# Patient Record
Sex: Male | Born: 1950 | Race: White | Hispanic: No | Marital: Married | Smoking: Never smoker
Health system: Southern US, Community
[De-identification: ages and names within clinical notes are randomized; demographics above are authoritative.]

## PROBLEM LIST (undated history)

## (undated) DIAGNOSIS — K219 Gastro-esophageal reflux disease without esophagitis: Secondary | ICD-10-CM

## (undated) DIAGNOSIS — Z8719 Personal history of other diseases of the digestive system: Secondary | ICD-10-CM

## (undated) DIAGNOSIS — I493 Ventricular premature depolarization: Secondary | ICD-10-CM

## (undated) DIAGNOSIS — B019 Varicella without complication: Secondary | ICD-10-CM

## (undated) DIAGNOSIS — I10 Essential (primary) hypertension: Secondary | ICD-10-CM

## (undated) DIAGNOSIS — E785 Hyperlipidemia, unspecified: Secondary | ICD-10-CM

## (undated) HISTORY — DX: Ventricular premature depolarization: I49.3

## (undated) HISTORY — DX: Essential (primary) hypertension: I10

## (undated) HISTORY — DX: Hyperlipidemia, unspecified: E78.5

## (undated) HISTORY — PX: COLONOSCOPY: SHX174

## (undated) HISTORY — DX: Gastro-esophageal reflux disease without esophagitis: K21.9

## (undated) HISTORY — PX: OTHER SURGICAL HISTORY: SHX169

## (undated) HISTORY — DX: Varicella without complication: B01.9

## (undated) HISTORY — DX: Personal history of other diseases of the digestive system: Z87.19

---

## 2012-01-17 DEATH — deceased

## 2015-04-28 DIAGNOSIS — L309 Dermatitis, unspecified: Secondary | ICD-10-CM | POA: Diagnosis not present

## 2015-04-28 DIAGNOSIS — L570 Actinic keratosis: Secondary | ICD-10-CM | POA: Diagnosis not present

## 2015-09-01 ENCOUNTER — Ambulatory Visit: Payer: Self-pay | Admitting: Family Medicine

## 2015-11-06 ENCOUNTER — Ambulatory Visit (INDEPENDENT_AMBULATORY_CARE_PROVIDER_SITE_OTHER): Payer: Medicare Other | Admitting: Family Medicine

## 2015-11-06 ENCOUNTER — Encounter: Payer: Self-pay | Admitting: Family Medicine

## 2015-11-06 VITALS — BP 117/67 | HR 79 | Temp 98.1°F | Ht 71.75 in | Wt 210.0 lb

## 2015-11-06 DIAGNOSIS — I10 Essential (primary) hypertension: Secondary | ICD-10-CM | POA: Insufficient documentation

## 2015-11-06 DIAGNOSIS — K219 Gastro-esophageal reflux disease without esophagitis: Secondary | ICD-10-CM | POA: Diagnosis not present

## 2015-11-06 DIAGNOSIS — Z1159 Encounter for screening for other viral diseases: Secondary | ICD-10-CM

## 2015-11-06 DIAGNOSIS — E785 Hyperlipidemia, unspecified: Secondary | ICD-10-CM | POA: Diagnosis not present

## 2015-11-06 DIAGNOSIS — G609 Hereditary and idiopathic neuropathy, unspecified: Secondary | ICD-10-CM

## 2015-11-06 MED ORDER — SIMVASTATIN 20 MG PO TABS: 20.0000 mg | ORAL_TABLET | Freq: Every day | ORAL | Status: DC

## 2015-11-06 MED ORDER — GABAPENTIN 300 MG PO CAPS: 300.0000 mg | ORAL_CAPSULE | Freq: Every day | ORAL | Status: DC

## 2015-11-06 MED ORDER — VALSARTAN-HYDROCHLOROTHIAZIDE 80-12.5 MG PO TABS: 1.0000 | ORAL_TABLET | Freq: Every day | ORAL | Status: DC

## 2015-11-06 NOTE — Progress Notes (Signed)
Pre visit review using our clinic review tool, if applicable. No additional management support is needed unless otherwise documented below in the visit note. 

## 2015-11-06 NOTE — Progress Notes (Signed)
Patient ID: David Nielsen, male    DOB: July 25, 1951  Age: 65 y.o. MRN: [id number]    Subjective:  Subjective HPI David Nielsen presents to establish and get refills --- hx htn, hyperlipidemia and peripheral neuropathy.    Review of Systems  Constitutional: Negative for diaphoresis, appetite change, fatigue and unexpected weight change.  Eyes: Negative for pain, redness and visual disturbance.  Respiratory: Negative for cough, chest tightness, shortness of breath and wheezing.   Cardiovascular: Negative for chest pain, palpitations and leg swelling.  Endocrine: Negative for cold intolerance, heat intolerance, polydipsia, polyphagia and polyuria.  Genitourinary: Negative for dysuria, frequency and difficulty urinating.  Neurological: Negative for dizziness, light-headedness, numbness and headaches.    History Past Medical History  Diagnosis Date  . Chicken pox   . H/O gastroesophageal reflux (GERD)   . PVC's (premature ventricular contractions)   . HTN (hypertension)   . Hyperlipidemia     He has no past surgical history on file.   His family history includes Alcohol abuse in his son; Alcohol abuse (age of onset: 16) in his father; Cancer in his sister; Colon cancer in his brother; Stroke in his mother.He reports that he has never smoked. He has never used smokeless tobacco. He reports that he drinks alcohol. He reports that he does not use illicit drugs.  No current outpatient prescriptions on file prior to visit.   No current facility-administered medications on file prior to visit.     Objective:  Objective Physical Exam  Constitutional: He is oriented to person, place, and time. Vital signs are normal. He appears well-developed and well-nourished. He is sleeping.  HENT:  Head: Normocephalic and atraumatic.  Mouth/Throat: Oropharynx is clear and moist.  Eyes: EOM are normal. Pupils are equal, round, and reactive to light.  Neck: Normal range of motion. Neck supple. No  thyromegaly present.  Cardiovascular: Normal rate and regular rhythm.   No murmur heard. Pulmonary/Chest: Effort normal and breath sounds normal. No respiratory distress. He has no wheezes. He has no rales. He exhibits no tenderness.  Musculoskeletal: He exhibits no edema or tenderness.  Neurological: He is alert and oriented to person, place, and time.  Skin: Skin is warm and dry.  Psychiatric: He has a normal mood and affect. His behavior is normal. Judgment and thought content normal.  Nursing note and vitals reviewed.  BP 117/67 mmHg  Pulse 79  Temp(Src) 98.1 F (36.7 C) (Oral)  Ht 5' 11.75" (1.822 m)  Wt 210 lb (95.255 kg)  BMI 28.69 kg/m2  SpO2 96% Wt Readings from Last 3 Encounters:  11/06/15 210 lb (95.255 kg)     No results found for: WBC, HGB, HCT, PLT, GLUCOSE, CHOL, TRIG, HDL, LDLDIRECT, LDLCALC, ALT, AST, NA, K, CL, CREATININE, BUN, CO2, TSH, PSA, INR, GLUF, HGBA1C, MICROALBUR  Patient was never admitted.   Assessment & Plan:  Plan I have changed Mr. Eschmann gabapentin and simvastatin. I am also having him maintain his valsartan-hydrochlorothiazide.  Meds ordered this encounter  Medications  . DISCONTD: gabapentin (NEURONTIN) 300 MG capsule    Sig: Take 300 mg by mouth at bedtime.  Marland Kitchen DISCONTD: simvastatin (ZOCOR) 20 MG tablet    Sig: Take 20 mg by mouth daily.  Marland Kitchen DISCONTD: valsartan-hydrochlorothiazide (DIOVAN-HCT) 80-12.5 MG tablet    Sig: Take 1 tablet by mouth daily.  Marland Kitchen gabapentin (NEURONTIN) 300 MG capsule    Sig: Take 1 capsule (300 mg total) by mouth at bedtime.    Dispense:  90 capsule  Refill:  1  . simvastatin (ZOCOR) 20 MG tablet    Sig: Take 1 tablet (20 mg total) by mouth daily.    Dispense:  90 tablet    Refill:  1  . valsartan-hydrochlorothiazide (DIOVAN-HCT) 80-12.5 MG tablet    Sig: Take 1 tablet by mouth daily.    Dispense:  90 tablet    Refill:  1    Problem List Items Addressed This Visit      Unprioritized   Peripheral  neuropathy, idiopathic   Relevant Medications   gabapentin (NEURONTIN) 300 MG capsule   Hyperlipidemia - Primary   Relevant Medications   gabapentin (NEURONTIN) 300 MG capsule   simvastatin (ZOCOR) 20 MG tablet   valsartan-hydrochlorothiazide (DIOVAN-HCT) 80-12.5 MG tablet   Other Relevant Orders   Comp Met (CMET)   CBC with Differential/Platelet   Lipid panel   POCT urinalysis dipstick   Microalbumin / creatinine urine ratio   HTN (hypertension)   Relevant Medications   simvastatin (ZOCOR) 20 MG tablet   valsartan-hydrochlorothiazide (DIOVAN-HCT) 80-12.5 MG tablet   Other Relevant Orders   Comp Met (CMET)   CBC with Differential/Platelet   Lipid panel   POCT urinalysis dipstick   Microalbumin / creatinine urine ratio   GERD (gastroesophageal reflux disease)    Other Visit Diagnoses    Hereditary and idiopathic peripheral neuropathy        Relevant Medications    gabapentin (NEURONTIN) 300 MG capsule    Need for hepatitis C screening test        Relevant Orders    Hepatitis C antibody       Follow-up: Return in about 3 months (around 02/04/2016), or if symptoms worsen or fail to improve, for annual exam, fasting.  Garnet Koyanagi, DO

## 2015-11-06 NOTE — Patient Instructions (Signed)
Hypertension Hypertension, commonly called high blood pressure, is when the force of blood pumping through your arteries is too strong. Your arteries are the blood vessels that carry blood from your heart throughout your body. A blood pressure reading consists of a higher number over a lower number, such as 110/72. The higher number (systolic) is the pressure inside your arteries when your heart pumps. The lower number (diastolic) is the pressure inside your arteries when your heart relaxes. Ideally you want your blood pressure below 120/80. Hypertension forces your heart to work harder to pump blood. Your arteries may become narrow or stiff. Having untreated or uncontrolled hypertension can cause heart attack, stroke, kidney disease, and other problems. RISK FACTORS Some risk factors for high blood pressure are controllable. Others are not.  Risk factors you cannot control include:   Race. You may be at higher risk if you are African American.  Age. Risk increases with age.  Gender. Men are at higher risk than women before age 45 years. After age 65, women are at higher risk than men. Risk factors you can control include:  Not getting enough exercise or physical activity.  Being overweight.  Getting too much fat, sugar, calories, or salt in your diet.  Drinking too much alcohol. SIGNS AND SYMPTOMS Hypertension does not usually cause signs or symptoms. Extremely high blood pressure (hypertensive crisis) may cause headache, anxiety, shortness of breath, and nosebleed. DIAGNOSIS To check if you have hypertension, your health care provider will measure your blood pressure while you are seated, with your arm held at the level of your heart. It should be measured at least twice using the same arm. Certain conditions can cause a difference in blood pressure between your right and left arms. A blood pressure reading that is higher than normal on one occasion does not mean that you need treatment. If  it is not clear whether you have high blood pressure, you may be asked to return on a different day to have your blood pressure checked again. Or, you may be asked to monitor your blood pressure at home for 1 or more weeks. TREATMENT Treating high blood pressure includes making lifestyle changes and possibly taking medicine. Living a healthy lifestyle can help lower high blood pressure. You may need to change some of your habits. Lifestyle changes may include:  Following the DASH diet. This diet is high in fruits, vegetables, and whole grains. It is low in salt, red meat, and added sugars.  Keep your sodium intake below 2,300 mg per day.  Getting at least 30-45 minutes of aerobic exercise at least 4 times per week.  Losing weight if necessary.  Not smoking.  Limiting alcoholic beverages.  Learning ways to reduce stress. Your health care provider may prescribe medicine if lifestyle changes are not enough to get your blood pressure under control, and if one of the following is true:  You are 18-59 years of age and your systolic blood pressure is above 140.  You are 60 years of age or older, and your systolic blood pressure is above 150.  Your diastolic blood pressure is above 90.  You have diabetes, and your systolic blood pressure is over 140 or your diastolic blood pressure is over 90.  You have kidney disease and your blood pressure is above 140/90.  You have heart disease and your blood pressure is above 140/90. Your personal target blood pressure may vary depending on your medical conditions, your age, and other factors. HOME CARE INSTRUCTIONS    Have your blood pressure rechecked as directed by your health care provider.   Take medicines only as directed by your health care provider. Follow the directions carefully. Blood pressure medicines must be taken as prescribed. The medicine does not work as well when you skip doses. Skipping doses also puts you at risk for  problems.  Do not smoke.   Monitor your blood pressure at home as directed by your health care provider. SEEK MEDICAL CARE IF:   You think you are having a reaction to medicines taken.  You have recurrent headaches or feel dizzy.  You have swelling in your ankles.  You have trouble with your vision. SEEK IMMEDIATE MEDICAL CARE IF:  You develop a severe headache or confusion.  You have unusual weakness, numbness, or feel faint.  You have severe chest or abdominal pain.  You vomit repeatedly.  You have trouble breathing. MAKE SURE YOU:   Understand these instructions.  Will watch your condition.  Will get help right away if you are not doing well or get worse.   This information is not intended to replace advice given to you by your health care provider. Make sure you discuss any questions you have with your health care provider.   Document Released: 10/04/2005 Document Revised: 02/18/2015 Document Reviewed: 07/27/2013 Elsevier Interactive Patient Education 2016 Elsevier Inc.  

## 2015-11-07 ENCOUNTER — Other Ambulatory Visit (INDEPENDENT_AMBULATORY_CARE_PROVIDER_SITE_OTHER): Payer: Medicare Other

## 2015-11-07 DIAGNOSIS — E785 Hyperlipidemia, unspecified: Secondary | ICD-10-CM

## 2015-11-07 DIAGNOSIS — Z1159 Encounter for screening for other viral diseases: Secondary | ICD-10-CM | POA: Diagnosis not present

## 2015-11-07 DIAGNOSIS — I10 Essential (primary) hypertension: Secondary | ICD-10-CM

## 2015-11-07 LAB — CBC WITH DIFFERENTIAL/PLATELET
BASOS ABS: 0 10*3/uL (ref 0.0–0.1)
Basophils Relative: 0.6 % (ref 0.0–3.0)
EOS PCT: 2.7 % (ref 0.0–5.0)
Eosinophils Absolute: 0.1 10*3/uL (ref 0.0–0.7)
HEMATOCRIT: 43.9 % (ref 39.0–52.0)
Hemoglobin: 14.7 g/dL (ref 13.0–17.0)
LYMPHS PCT: 25.2 % (ref 12.0–46.0)
Lymphs Abs: 1.4 10*3/uL (ref 0.7–4.0)
MCHC: 33.5 g/dL (ref 30.0–36.0)
MCV: 88 fl (ref 78.0–100.0)
MONOS PCT: 9.4 % (ref 3.0–12.0)
Monocytes Absolute: 0.5 10*3/uL (ref 0.1–1.0)
NEUTROS ABS: 3.5 10*3/uL (ref 1.4–7.7)
Neutrophils Relative %: 62.1 % (ref 43.0–77.0)
PLATELETS: 260 10*3/uL (ref 150.0–400.0)
RBC: 4.99 Mil/uL (ref 4.22–5.81)
RDW: 13 % (ref 11.5–15.5)
WBC: 5.6 10*3/uL (ref 4.0–10.5)

## 2015-11-07 LAB — COMPREHENSIVE METABOLIC PANEL
ALT: 27 U/L (ref 0–53)
AST: 24 U/L (ref 0–37)
Albumin: 4.3 g/dL (ref 3.5–5.2)
Alkaline Phosphatase: 43 U/L (ref 39–117)
BILIRUBIN TOTAL: 0.7 mg/dL (ref 0.2–1.2)
BUN: 15 mg/dL (ref 6–23)
CALCIUM: 9.9 mg/dL (ref 8.4–10.5)
CO2: 32 meq/L (ref 19–32)
Chloride: 100 mEq/L (ref 96–112)
Creatinine, Ser: 1 mg/dL (ref 0.40–1.50)
GFR: 79.56 mL/min (ref >60.00)
Glucose, Bld: 101 mg/dL — ABNORMAL HIGH (ref 70–99)
POTASSIUM: 4 meq/L (ref 3.5–5.1)
Sodium: 138 mEq/L (ref 135–145)
Total Protein: 7.2 g/dL (ref 6.0–8.3)

## 2015-11-07 LAB — LIPID PANEL
Cholesterol: 145 mg/dL (ref 0–200)
HDL: 36.4 mg/dL — AB (ref >39.00)
NONHDL: 108.22
Total CHOL/HDL Ratio: 4
Triglycerides: 214 mg/dL — ABNORMAL HIGH (ref 0.0–149.0)
VLDL: 42.8 mg/dL — AB (ref 0.0–40.0)

## 2015-11-07 LAB — LDL CHOLESTEROL, DIRECT: Direct LDL: 73 mg/dL

## 2015-11-07 LAB — HEPATITIS C ANTIBODY: HCV AB: NEGATIVE

## 2015-11-07 NOTE — Addendum Note (Signed)
Addended by: Peggyann Shoals on: 11/07/2015 11:51 AM   Modules accepted: Orders

## 2016-02-17 ENCOUNTER — Encounter: Payer: Self-pay | Admitting: Family Medicine

## 2016-02-17 ENCOUNTER — Ambulatory Visit (INDEPENDENT_AMBULATORY_CARE_PROVIDER_SITE_OTHER): Payer: Medicare Other | Admitting: Family Medicine

## 2016-02-17 VITALS — BP 136/76 | HR 76 | Temp 98.1°F | Ht 72.0 in | Wt 203.4 lb

## 2016-02-17 DIAGNOSIS — I10 Essential (primary) hypertension: Secondary | ICD-10-CM

## 2016-02-17 DIAGNOSIS — E785 Hyperlipidemia, unspecified: Secondary | ICD-10-CM

## 2016-02-17 DIAGNOSIS — Z0000 Encounter for general adult medical examination without abnormal findings: Secondary | ICD-10-CM | POA: Diagnosis not present

## 2016-02-17 MED ORDER — VALSARTAN-HYDROCHLOROTHIAZIDE 80-12.5 MG PO TABS: 1.0000 | ORAL_TABLET | Freq: Every day | ORAL | Status: DC

## 2016-02-17 NOTE — Progress Notes (Signed)
Pre visit review using our clinic review tool, if applicable. No additional management support is needed unless otherwise documented below in the visit note. 

## 2016-02-17 NOTE — Patient Instructions (Signed)

## 2016-02-17 NOTE — Progress Notes (Signed)
Subjective:   David Nielsen is a 65 y.o. male who presents for a Welcome to Medicare exam.   Review of Systems:  Review of Systems  Constitutional: Negative for activity change, appetite change and fatigue.  HENT: Negative for hearing loss, congestion, tinnitus and ear discharge.   Eyes: Negative for visual disturbance (see optho q1y -- vision corrected to 20/20 with glasses).  Respiratory: Negative for cough, chest tightness and shortness of breath.   Cardiovascular: Negative for chest pain, palpitations and leg swelling.  Gastrointestinal: Negative for abdominal pain, diarrhea, constipation and abdominal distention.  Genitourinary: Negative for urgency, frequency, decreased urine volume and difficulty urinating.  Musculoskeletal: Negative for back pain, arthralgias and gait problem.  Skin: Negative for color change, pallor and rash.  Neurological: Negative for dizziness, light-headedness, numbness and headaches.  Hematological: Negative for adenopathy. Does not bruise/bleed easily.  Psychiatric/Behavioral: Negative for suicidal ideas, confusion, sleep disturbance, self-injury, dysphoric mood, decreased concentration and agitation.  Pt is able to read and write and can do all ADLs No risk for falling No abuse/ violence in home           Objective:    Today's Vitals   02/17/16 1326  BP: 136/76  Pulse: 76  Temp: 98.1 F (36.7 C)  TempSrc: Oral  Height: 6' (1.829 m)  Weight: 203 lb 6.4 oz (92.262 kg)  SpO2: 96%   Body mass index is 27.58 kg/(m^2).  Medications Outpatient Encounter Prescriptions as of 02/17/2016  Medication Sig  . gabapentin (NEURONTIN) 300 MG capsule Take 1 capsule (300 mg total) by mouth at bedtime.  . simvastatin (ZOCOR) 20 MG tablet Take 1 tablet (20 mg total) by mouth daily.  . valsartan-hydrochlorothiazide (DIOVAN-HCT) 80-12.5 MG tablet Take 1 tablet by mouth daily.  . [DISCONTINUED] valsartan-hydrochlorothiazide (DIOVAN-HCT) 80-12.5 MG tablet  Take 1 tablet by mouth daily.   No facility-administered encounter medications on file as of 02/17/2016.     History: Past Medical History  Diagnosis Date  . Chicken pox   . H/O gastroesophageal reflux (GERD)   . PVC's (premature ventricular contractions)   . HTN (hypertension)   . Hyperlipidemia    History reviewed. No pertinent past surgical history.  Family History  Problem Relation Age of Onset  . Alcohol abuse Father 90    pneumonia  . Alcohol abuse Son   . Colon cancer Brother   . Stroke Mother     Smoker  . Cancer Sister     breast   Social History   Occupational History  .      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  [employer]   Social History Main Topics  . Smoking status: Never Smoker   . Smokeless tobacco: Never Used  . Alcohol Use: 0.0 oz/week    0 Standard drinks or equivalent per week     Comment: rare -- wine -- weekend  . Drug Use: No  . Sexual Activity:    Partners: Female, Male   Tobacco Counseling Counseling given: Not Answered   Immunizations and Health Maintenance  There is no immunization history on file for this patient. There are no preventive care reminders to display for this patient.  Activities of Daily Living In your present state of health, do you have any difficulty performing the following activities: 02/17/2016 02/17/2016  Hearing? N Y  Vision? N N  Difficulty concentrating or making decisions? N N  Walking or climbing stairs? N N  Dressing or bathing? N N  Doing errands, shopping? N N    Physical Exam  \\ BP 136/76 mmHg  Pulse 76  Temp(Src) 98.1 F (36.7 C) (Oral)  Ht 6' (1.829 m)  Wt 203 lb 6.4 oz (92.262 kg)  BMI 27.58 kg/m2  SpO2 96% General appearance: alert, cooperative, appears stated age and no distress Head: Normocephalic, without obvious abnormality, atraumatic Eyes: negative findings: lids and lashes normal, conjunctivae and sclerae normal and pupils equal, round, reactive to light and  accomodation Ears: normal TM's and external ear canals both ears Nose: Nares normal. Septum midline. Mucosa normal. No drainage or sinus tenderness. Throat: lips, mucosa, and tongue normal; teeth and gums normal Neck: no adenopathy, no carotid bruit, no JVD, supple, symmetrical, trachea midline and thyroid not enlarged, symmetric, no tenderness/mass/nodules Back: symmetric, no curvature. ROM normal. No CVA tenderness. Lungs: clear to auscultation bilaterally Chest wall: no tenderness Heart: regular rate and rhythm, S1, S2 normal, no murmur, click, rub or gallop Abdomen: soft, non-tender; bowel sounds normal; no masses,  no organomegaly Male genitalia: normal, penis: no lesions or discharge. testes: no masses or tenderness. no hernias Rectal: normal tone, normal prostate, no masses or tenderness and soft brown guaiac negative stool noted Extremities: extremities normal, atraumatic, no cyanosis or edema Pulses: 2+ and symmetric Skin: Skin color, texture, turgor normal. No rashes or lesions Lymph nodes: Cervical, supraclavicular, and axillary nodes normal. Neurologic: Alert and oriented X 3, normal strength and tone. Normal symmetric reflexes. Normal coordination and gait  (optional), or other factors deemed appropriate based on the beneficiary's medical and social history and current clinical standards.  Advanced Directives: Does patient have an advance directive?: No Would patient like information on creating an advanced directive?: Yes - Educational materials given    Assessment:    This is a routine wellness  examination for this patient .   Vision/Hearing screen Hearing Screening Comments: + [place]  + high frequency hearing loss Vision Screening Comments: opht--  [facility] opth  Dietary issues and exercise activities discussed:  Current Exercise Habits: Structured exercise class, Type of exercise: strength training/weights, Time (Minutes): 60, Frequency (Times/Week): 4, Weekly  Exercise (Minutes/Week): 240, Intensity: Moderate, Exercise limited by: cardiac condition(s);None identified  Goals    None      Depression Screen PHQ 2/9 Scores 02/17/2016 02/17/2016 11/06/2015  PHQ - 2 Score 0 0 0     Fall Risk Fall Risk  02/17/2016  Falls in the past year? Yes  Number falls in past yr: 1  Injury with Fall? No  Risk for fall due to : Other (Comment)  Risk for fall due to (comments): ladder leaned and fell over  Follow up Education provided    MMSE: 30/30  Patient Care Team: Ann Held, DO as PCP - General (Family Medicine) Lavonna Monarch, MD as Consulting Physician (Dermatology)     Plan:    see avs  During the course of the visit,Alanzo was educated and counseled about the following appropriate screening and preventive services:   Vaccines to include Pneumoccal, Influenza, Hepatitis B, Td, Zostavax, HCV  Electrocardiogram  Cardiovascular Disease  Colorectal cancer screening  Diabetes screening  Glaucoma screening  Nutrition counseling  Prostate cancer screening  Smoking cessation counseling  His current medications and allergies were reviewed and needed refills of his chronic medications were ordered. The plan for yearly health maintenance was discussed all orders and referrals were made as appropriate.  Patient Instructions (the written plan) was given to the patient.  1. Welcome to Medicare preventive  visit See above - EKG 12-Lead  2. Hyperlipidemia Check labs - valsartan-hydrochlorothiazide (DIOVAN-HCT) 80-12.5 MG tablet; Take 1 tablet by mouth daily.  Dispense: 90 tablet; Refill: 1 - Comprehensive metabolic panel; Future - Lipid panel; Future  3. Essential hypertension stable - valsartan-hydrochlorothiazide (DIOVAN-HCT) 80-12.5 MG tablet; Take 1 tablet by mouth daily.  Dispense: 90 tablet; Refill: 1 - Comprehensive metabolic panel; Future   [place], DO 02/17/2016

## 2016-02-24 ENCOUNTER — Other Ambulatory Visit (INDEPENDENT_AMBULATORY_CARE_PROVIDER_SITE_OTHER): Payer: Medicare Other

## 2016-02-24 DIAGNOSIS — E785 Hyperlipidemia, unspecified: Secondary | ICD-10-CM | POA: Diagnosis not present

## 2016-02-24 DIAGNOSIS — I10 Essential (primary) hypertension: Secondary | ICD-10-CM

## 2016-02-24 LAB — COMPREHENSIVE METABOLIC PANEL
ALT: 20 U/L (ref 0–53)
AST: 23 U/L (ref 0–37)
Albumin: 4.4 g/dL (ref 3.5–5.2)
Alkaline Phosphatase: 39 U/L (ref 39–117)
BILIRUBIN TOTAL: 0.7 mg/dL (ref 0.2–1.2)
BUN: 16 mg/dL (ref 6–23)
CHLORIDE: 100 meq/L (ref 96–112)
CO2: 31 meq/L (ref 19–32)
Calcium: 9.8 mg/dL (ref 8.4–10.5)
Creatinine, Ser: 0.91 mg/dL (ref 0.40–1.50)
GFR: 88.63 mL/min (ref >60.00)
GLUCOSE: 104 mg/dL — AB (ref 70–99)
Potassium: 3.7 mEq/L (ref 3.5–5.1)
Sodium: 140 mEq/L (ref 135–145)
Total Protein: 7 g/dL (ref 6.0–8.3)

## 2016-02-24 LAB — LIPID PANEL
CHOL/HDL RATIO: 3
Cholesterol: 132 mg/dL (ref 0–200)
HDL: 37.7 mg/dL — AB (ref >39.00)
LDL CALC: 60 mg/dL (ref 0–99)
NONHDL: 94.06
TRIGLYCERIDES: 172 mg/dL — AB (ref 0.0–149.0)
VLDL: 34.4 mg/dL (ref 0.0–40.0)

## 2016-05-04 ENCOUNTER — Other Ambulatory Visit: Payer: Self-pay | Admitting: Family Medicine

## 2016-08-20 ENCOUNTER — Ambulatory Visit (INDEPENDENT_AMBULATORY_CARE_PROVIDER_SITE_OTHER): Payer: Medicare Other | Admitting: Family Medicine

## 2016-08-20 ENCOUNTER — Encounter: Payer: Self-pay | Admitting: Family Medicine

## 2016-08-20 VITALS — BP 100/60 | HR 79 | Temp 98.3°F | Resp 16 | Ht 74.0 in | Wt 205.6 lb

## 2016-08-20 DIAGNOSIS — K219 Gastro-esophageal reflux disease without esophagitis: Secondary | ICD-10-CM | POA: Diagnosis not present

## 2016-08-20 DIAGNOSIS — E785 Hyperlipidemia, unspecified: Secondary | ICD-10-CM | POA: Diagnosis not present

## 2016-08-20 DIAGNOSIS — K625 Hemorrhage of anus and rectum: Secondary | ICD-10-CM | POA: Diagnosis not present

## 2016-08-20 DIAGNOSIS — I10 Essential (primary) hypertension: Secondary | ICD-10-CM

## 2016-08-20 DIAGNOSIS — R7309 Other abnormal glucose: Secondary | ICD-10-CM

## 2016-08-20 LAB — LIPID PANEL
CHOL/HDL RATIO: 4
CHOLESTEROL: 146 mg/dL (ref 0–200)
HDL: 39.5 mg/dL (ref >39.00)
NonHDL: 106.62
TRIGLYCERIDES: 313 mg/dL — AB (ref 0.0–149.0)
VLDL: 62.6 mg/dL — AB (ref 0.0–40.0)

## 2016-08-20 LAB — COMPREHENSIVE METABOLIC PANEL
ALBUMIN: 4.4 g/dL (ref 3.5–5.2)
ALT: 22 U/L (ref 0–53)
AST: 21 U/L (ref 0–37)
Alkaline Phosphatase: 40 U/L (ref 39–117)
BUN: 12 mg/dL (ref 6–23)
CALCIUM: 9.9 mg/dL (ref 8.4–10.5)
CHLORIDE: 101 meq/L (ref 96–112)
CO2: 33 mEq/L — ABNORMAL HIGH (ref 19–32)
Creatinine, Ser: 0.93 mg/dL (ref 0.40–1.50)
GFR: 86.31 mL/min (ref >60.00)
Glucose, Bld: 118 mg/dL — ABNORMAL HIGH (ref 70–99)
POTASSIUM: 4.3 meq/L (ref 3.5–5.1)
SODIUM: 139 meq/L (ref 135–145)
Total Bilirubin: 0.5 mg/dL (ref 0.2–1.2)
Total Protein: 7.1 g/dL (ref 6.0–8.3)

## 2016-08-20 LAB — CBC WITH DIFFERENTIAL/PLATELET
Basophils Absolute: 0 10*3/uL (ref 0.0–0.1)
Basophils Relative: 0.7 % (ref 0.0–3.0)
Eosinophils Absolute: 0.2 10*3/uL (ref 0.0–0.7)
Eosinophils Relative: 3.3 % (ref 0.0–5.0)
HCT: 41.8 % (ref 39.0–52.0)
Hemoglobin: 14.5 g/dL (ref 13.0–17.0)
LYMPHS PCT: 21.8 % (ref 12.0–46.0)
Lymphs Abs: 1.3 10*3/uL (ref 0.7–4.0)
MCHC: 34.8 g/dL (ref 30.0–36.0)
MCV: 85.9 fl (ref 78.0–100.0)
Monocytes Absolute: 0.5 10*3/uL (ref 0.1–1.0)
Monocytes Relative: 7.8 % (ref 3.0–12.0)
NEUTROS PCT: 66.4 % (ref 43.0–77.0)
Neutro Abs: 3.9 10*3/uL (ref 1.4–7.7)
PLATELETS: 250 10*3/uL (ref 150.0–400.0)
RBC: 4.86 Mil/uL (ref 4.22–5.81)
RDW: 13 % (ref 11.5–15.5)
WBC: 5.9 10*3/uL (ref 4.0–10.5)

## 2016-08-20 LAB — LDL CHOLESTEROL, DIRECT: LDL DIRECT: 81 mg/dL

## 2016-08-20 MED ORDER — SIMVASTATIN 20 MG PO TABS: 20.0000 mg | ORAL_TABLET | Freq: Every day | ORAL | 1 refills | Status: DC

## 2016-08-20 MED ORDER — VALSARTAN-HYDROCHLOROTHIAZIDE 80-12.5 MG PO TABS: 1.0000 | ORAL_TABLET | Freq: Every day | ORAL | 1 refills | Status: DC

## 2016-08-20 NOTE — Assessment & Plan Note (Signed)
Better with meds

## 2016-08-20 NOTE — Patient Instructions (Signed)

## 2016-08-20 NOTE — Progress Notes (Signed)
Pre visit review using our clinic review tool, if applicable. No additional management support is needed unless otherwise documented below in the visit note. 

## 2016-08-20 NOTE — Assessment & Plan Note (Signed)
Check labs Refer to gi  Stool softener Fiber Drink plenty of water Call or rto prn

## 2016-08-20 NOTE — Progress Notes (Signed)
Patient ID: David Nielsen, male    DOB: 05-Mar-1951  Age: 65 y.o. MRN: [id number]    Subjective:  Subjective  HPI David Nielsen presents for f/u bp and cholesterol .  He is also c/o hard stools with some blood.  He doesn't think he has hemorrhoids.   His brother had precancerous polyps and pt has colon q5y.    Review of Systems  Constitutional: Negative for appetite change, diaphoresis, fatigue and unexpected weight change.  Eyes: Negative for pain, redness and visual disturbance.  Respiratory: Negative for cough, chest tightness, shortness of breath and wheezing.   Cardiovascular: Negative for chest pain, palpitations and leg swelling.  Endocrine: Negative for cold intolerance, heat intolerance, polydipsia, polyphagia and polyuria.  Genitourinary: Negative for difficulty urinating, dysuria and frequency.  Neurological: Negative for dizziness, light-headedness, numbness and headaches.    History Past Medical History:  Diagnosis Date  . Chicken pox   . H/O gastroesophageal reflux (GERD)   . HTN (hypertension)   . Hyperlipidemia   . PVC's (premature ventricular contractions)     He has no past surgical history on file.   His family history includes Alcohol abuse in his son; Alcohol abuse (age of onset: 35) in his father; Cancer in his sister; Colon cancer in his brother; Stroke in his mother.He reports that he has never smoked. He has never used smokeless tobacco. He reports that he drinks alcohol. He reports that he does not use drugs.  Current Outpatient Prescriptions on File Prior to Visit  Medication Sig Dispense Refill  . gabapentin (NEURONTIN) 300 MG capsule TAKE 1 CAPSULE AT BEDTIME 90 capsule 1   No current facility-administered medications on file prior to visit.      Objective:  Objective  Physical Exam  Constitutional: He is oriented to person, place, and time. Vital signs are normal. He appears well-developed and well-nourished. He is sleeping.  HENT:  Head:  Normocephalic and atraumatic.  Mouth/Throat: Oropharynx is clear and moist.  Eyes: EOM are normal. Pupils are equal, round, and reactive to light.  Neck: Normal range of motion. Neck supple. No thyromegaly present.  Cardiovascular: Normal rate and regular rhythm.   No murmur heard. Pulmonary/Chest: Effort normal and breath sounds normal. No respiratory distress. He has no wheezes. He has no rales. He exhibits no tenderness.  Genitourinary: Rectal exam shows guaiac positive stool. Rectal exam shows no external hemorrhoid.  Musculoskeletal: He exhibits no edema or tenderness.  Neurological: He is alert and oriented to person, place, and time.  Skin: Skin is warm and dry.  Psychiatric: He has a normal mood and affect. His behavior is normal. Judgment and thought content normal.  Nursing note and vitals reviewed.  BP 100/60 (BP Location: Left Arm, Patient Position: Sitting, Cuff Size: Large)   Pulse 79   Temp 98.3 F (36.8 C) (Oral)   Resp 16   Ht 6\' 2"  (1.88 m)   Wt 205 lb 9.6 oz (93.3 kg)   SpO2 95%   BMI 26.40 kg/m  Wt Readings from Last 3 Encounters:  08/20/16 205 lb 9.6 oz (93.3 kg)  02/17/16 203 lb 6.4 oz (92.3 kg)  11/06/15 210 lb (95.3 kg)     Lab Results  Component Value Date   WBC 5.6 11/07/2015   HGB 14.7 11/07/2015   HCT 43.9 11/07/2015   PLT 260.0 11/07/2015   GLUCOSE 104 (H) 02/24/2016   CHOL 132 02/24/2016   TRIG 172.0 (H) 02/24/2016   HDL 37.70 (L) 02/24/2016   LDLDIRECT  73.0 11/07/2015   LDLCALC 60 02/24/2016   ALT 20 02/24/2016   AST 23 02/24/2016   NA 140 02/24/2016   K 3.7 02/24/2016   CL 100 02/24/2016   CREATININE 0.91 02/24/2016   BUN 16 02/24/2016   CO2 31 02/24/2016    Patient was never admitted.   Assessment & Plan:  Plan  I am having Mr. Alvarado maintain his gabapentin, valsartan-hydrochlorothiazide, and simvastatin.  Meds ordered this encounter  Medications  . valsartan-hydrochlorothiazide (DIOVAN-HCT) 80-12.5 MG tablet    Sig:  Take 1 tablet by mouth daily.    Dispense:  90 tablet    Refill:  1  . simvastatin (ZOCOR) 20 MG tablet    Sig: Take 1 tablet (20 mg total) by mouth daily.    Dispense:  90 tablet    Refill:  1    Problem List Items Addressed This Visit      Unprioritized   GERD (gastroesophageal reflux disease)    Better with meds      HTN (hypertension)    Stable con't meds Check labs      Relevant Medications   valsartan-hydrochlorothiazide (DIOVAN-HCT) 80-12.5 MG tablet   simvastatin (ZOCOR) 20 MG tablet   Hyperlipidemia    Check labs con't zocor      Relevant Medications   valsartan-hydrochlorothiazide (DIOVAN-HCT) 80-12.5 MG tablet   simvastatin (ZOCOR) 20 MG tablet   Other Relevant Orders   Comprehensive metabolic panel   Lipid panel   Rectal bleeding - Primary    Check labs Refer to gi  Stool softener Fiber Drink plenty of water Call or rto prn      Relevant Orders   CBC with Differential/Platelet   Ambulatory referral to Gastroenterology    Other Visit Diagnoses   None.     Follow-up: Return in about 6 months (around 02/17/2017) for annual exam, fasting.  Ann Held, DO

## 2016-08-20 NOTE — Assessment & Plan Note (Signed)
Check labs  con't zocor 

## 2016-08-20 NOTE — Assessment & Plan Note (Signed)
Stable con't meds Check labs 

## 2016-08-23 ENCOUNTER — Encounter: Payer: Self-pay | Admitting: Gastroenterology

## 2016-08-23 MED ORDER — FENOFIBRATE 160 MG PO TABS: 160.0000 mg | ORAL_TABLET | Freq: Every day | ORAL | 2 refills | Status: DC

## 2016-08-23 NOTE — Addendum Note (Signed)
Addended by: Burna Cellar on: 08/23/2016 01:36 PM   Modules accepted: Orders

## 2016-09-23 ENCOUNTER — Encounter: Payer: Self-pay | Admitting: Family Medicine

## 2016-09-28 ENCOUNTER — Telehealth: Payer: Self-pay

## 2016-09-28 NOTE — Telephone Encounter (Signed)
Left a detailed message on pt's vm on instruction per provider to take Zocor and fenofibrate together and recheck labs in February as well on a regular basis. LB

## 2016-09-28 NOTE — Telephone Encounter (Signed)
-----   Message from Ann Held, DO sent at 09/24/2016  1:15 PM EST ----- Pt stopped zocor when I added fenofibrate.  He is supposed to take both and labs are supposed to be repeated in Feb--- see labs We rx fenofibrate and zocor together all the time--- important to get labs done regularly

## 2016-10-25 ENCOUNTER — Encounter: Payer: Self-pay | Admitting: Gastroenterology

## 2016-10-25 ENCOUNTER — Other Ambulatory Visit: Payer: Self-pay | Admitting: Family Medicine

## 2016-10-25 ENCOUNTER — Ambulatory Visit (INDEPENDENT_AMBULATORY_CARE_PROVIDER_SITE_OTHER): Payer: Medicare Other | Admitting: Gastroenterology

## 2016-10-25 VITALS — BP 130/62 | HR 72 | Ht 71.75 in | Wt 213.1 lb

## 2016-10-25 DIAGNOSIS — R195 Other fecal abnormalities: Secondary | ICD-10-CM | POA: Diagnosis not present

## 2016-10-25 DIAGNOSIS — K921 Melena: Secondary | ICD-10-CM

## 2016-10-25 DIAGNOSIS — Z800 Family history of malignant neoplasm of digestive organs: Secondary | ICD-10-CM | POA: Diagnosis not present

## 2016-10-25 NOTE — Patient Instructions (Signed)
It has been recommended to you by your physician that you have a(n) Colonoscopy completed. Per your request, we did not schedule the procedure(s) today. Please contact our office at [phone] should you decide to have the procedure completed.  Thank you for choosing me and [facility].  Malcolm T. Stark, Jr., MD., FACG  

## 2016-10-25 NOTE — Progress Notes (Signed)
History of Present Illness: This is a 66 year old male referred by Ann Held, *DO for the evaluation of anal pain, hematochezia and heme pos stool. Patient relates mild anal discomfort at the start of a bowel movement and then it dissipates. He also has a history of mild constipation and hard stools and these symptoms have improved since using stool softener. He has a family history of colon cancer in state he states he has had 2 prior colonoscopies performed in [place], [place]. He does not recall any findings. Denies weight loss, abdominal pain, diarrhea, change in stool caliber, melena, nausea, vomiting, dysphagia, reflux symptoms, chest pain.  No Known Allergies Outpatient Medications Prior to Visit  Medication Sig Dispense Refill  . fenofibrate 160 MG tablet Take 1 tablet (160 mg total) by mouth daily. 30 tablet 2  . gabapentin (NEURONTIN) 300 MG capsule TAKE 1 CAPSULE AT BEDTIME 90 capsule 1  . simvastatin (ZOCOR) 20 MG tablet Take 1 tablet (20 mg total) by mouth daily. 90 tablet 1  . valsartan-hydrochlorothiazide (DIOVAN-HCT) 80-12.5 MG tablet Take 1 tablet by mouth daily. 90 tablet 1   No facility-administered medications prior to visit.    Past Medical History:  Diagnosis Date  . Chicken pox   . H/O gastroesophageal reflux (GERD)   . HTN (hypertension)   . Hyperlipidemia   . PVC's (premature ventricular contractions)    Past Surgical History:  Procedure Laterality Date  . none     Social History   Social History  . Marital status: Married    Spouse name: N/A  . Number of children: 2  . Years of education: N/A   Occupational History  . 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  [employer]   Social History Main Topics  . Smoking status: Never Smoker  . Smokeless tobacco: Never Used  . Alcohol use 0.0 oz/week     Comment: rare -- wine -- weekend  . Drug use: No  . Sexual activity: Yes    Partners: Female, Male   Other  Topics Concern  . None   Social History Narrative   Exercise-- 3x a week on total gym and treadmill   Family History  Problem Relation Age of Onset  . Alcohol abuse Father 21    pneumonia  . Stroke Mother     Smoker  . Cancer Sister     breast  . Colon cancer Brother     colon polyps  . Alcohol abuse Son   . Stomach cancer Neg Hx   . Rectal cancer Neg Hx   . Esophageal cancer Neg Hx   . Liver cancer Neg Hx        Review of Systems: Pertinent positive and negative review of systems were noted in the above HPI section. All other review of systems were otherwise negative.   Physical Exam: General: Well developed, well nourished, no acute distress Head: Normocephalic and atraumatic Eyes:  sclerae anicteric, EOMI Ears: Normal auditory acuity Mouth: No deformity or lesions Neck: Supple, no masses or thyromegaly Lungs: Clear throughout to auscultation Heart: Regular rate and rhythm; no murmurs, rubs or bruits Abdomen: Soft, non tender and non distended. No masses, hepatosplenomegaly or hernias noted. Normal Bowel sounds Rectal: deferred, recent DRE by Dr. Etter Sjogren showed no lesions and heme + stool Musculoskeletal: Symmetrical with no gross deformities  Skin: No lesions on visible extremities Pulses:  Normal pulses noted Extremities: No clubbing, cyanosis,  edema or deformities noted Neurological: Alert oriented x 4, grossly nonfocal Cervical Nodes:  No significant cervical adenopathy Inguinal Nodes: No significant inguinal adenopathy Psychological:  Alert and cooperative. Normal mood and affect  Assessment and Recommendations:  1.  Hematochezia, heme pos stool, anal pain, constipation. High fiber diet with adequate daily water intake. Continue the regular use of stool softeners. Suspected rectal source of discomfort and bleeding such as internal hemorrhoids however colorectal neoplasms and other disorders need to be excluded. Attempt to obtain records from prior colonoscopies.  Schedule colonoscopy. The risks (including bleeding, perforation, infection, missed lesions, medication reactions and possible hospitalization or surgery if complications occur), benefits, and alternatives to colonoscopy with possible biopsy and possible polypectomy were discussed with the patient and they consent to proceed.      cc: Ann Held, DO [address] [place], [place] [address]

## 2016-11-28 ENCOUNTER — Encounter: Payer: Self-pay | Admitting: Family Medicine

## 2016-11-29 NOTE — Telephone Encounter (Signed)
Langley Gauss-- please call pt to schedule a lab appt for CBC and CMP anytime this month. Orders have already been placed. Thanks!

## 2016-12-02 ENCOUNTER — Ambulatory Visit (AMBULATORY_SURGERY_CENTER): Payer: Self-pay

## 2016-12-02 VITALS — Ht 70.0 in | Wt 212.2 lb

## 2016-12-02 DIAGNOSIS — Z1211 Encounter for screening for malignant neoplasm of colon: Secondary | ICD-10-CM

## 2016-12-02 MED ORDER — SUPREP BOWEL PREP KIT 17.5-3.13-1.6 GM/177ML PO SOLN: 1.0000 | Freq: Once | ORAL | 0 refills | Status: AC

## 2016-12-02 NOTE — Progress Notes (Signed)
No allergies to eggs or soy No past problems with anesthesia No diet meds No home oxygen  Registered for emmi 

## 2016-12-07 ENCOUNTER — Other Ambulatory Visit (INDEPENDENT_AMBULATORY_CARE_PROVIDER_SITE_OTHER): Payer: Medicare Other

## 2016-12-07 DIAGNOSIS — R7309 Other abnormal glucose: Secondary | ICD-10-CM

## 2016-12-07 DIAGNOSIS — E785 Hyperlipidemia, unspecified: Secondary | ICD-10-CM

## 2016-12-07 LAB — COMPREHENSIVE METABOLIC PANEL
ALT: 38 U/L (ref 0–53)
AST: 35 U/L (ref 0–37)
Albumin: 4.4 g/dL (ref 3.5–5.2)
Alkaline Phosphatase: 26 U/L — ABNORMAL LOW (ref 39–117)
BUN: 15 mg/dL (ref 6–23)
CHLORIDE: 100 meq/L (ref 96–112)
CO2: 32 mEq/L (ref 19–32)
Calcium: 9.7 mg/dL (ref 8.4–10.5)
Creatinine, Ser: 1.15 mg/dL (ref 0.40–1.50)
GFR: 67.49 mL/min (ref >60.00)
GLUCOSE: 99 mg/dL (ref 70–99)
POTASSIUM: 3.6 meq/L (ref 3.5–5.1)
Sodium: 137 mEq/L (ref 135–145)
Total Bilirubin: 0.6 mg/dL (ref 0.2–1.2)
Total Protein: 7 g/dL (ref 6.0–8.3)

## 2016-12-07 LAB — HEMOGLOBIN A1C: HEMOGLOBIN A1C: 6.3 % (ref 4.6–6.5)

## 2016-12-16 ENCOUNTER — Encounter: Payer: Self-pay | Admitting: Gastroenterology

## 2016-12-16 ENCOUNTER — Ambulatory Visit (AMBULATORY_SURGERY_CENTER): Payer: Medicare Other | Admitting: Gastroenterology

## 2016-12-16 ENCOUNTER — Telehealth: Payer: Self-pay | Admitting: Emergency Medicine

## 2016-12-16 VITALS — BP 100/39 | HR 62 | Temp 95.9°F | Resp 18 | Ht 70.0 in | Wt 212.0 lb

## 2016-12-16 DIAGNOSIS — K921 Melena: Secondary | ICD-10-CM

## 2016-12-16 DIAGNOSIS — R195 Other fecal abnormalities: Secondary | ICD-10-CM

## 2016-12-16 DIAGNOSIS — K219 Gastro-esophageal reflux disease without esophagitis: Secondary | ICD-10-CM | POA: Diagnosis not present

## 2016-12-16 DIAGNOSIS — I10 Essential (primary) hypertension: Secondary | ICD-10-CM | POA: Diagnosis not present

## 2016-12-16 DIAGNOSIS — K635 Polyp of colon: Secondary | ICD-10-CM | POA: Diagnosis not present

## 2016-12-16 DIAGNOSIS — D124 Benign neoplasm of descending colon: Secondary | ICD-10-CM

## 2016-12-16 DIAGNOSIS — Z1211 Encounter for screening for malignant neoplasm of colon: Secondary | ICD-10-CM | POA: Diagnosis not present

## 2016-12-16 MED ORDER — SODIUM CHLORIDE 0.9 % IV SOLN: 500.0000 mL | INTRAVENOUS | Status: DC

## 2016-12-16 NOTE — Telephone Encounter (Signed)
Dr.Lowne asked me to call the patient back in to have Lipid panel drawn. She requested it on 12-09-16 but the order was not in Epic with the other labs. Patient needs lab appt schedule for lipid panel.

## 2016-12-16 NOTE — Op Note (Signed)
[facility] Patient Name: David Nielsen Procedure Date: 12/16/2016 8:28 AM MRN: [id number] Endoscopist: Ladene Artist , MD Age: 66 Referring MD:  Date of Birth: [birth year] Gender: Male Account #: [id number] Procedure:                Colonoscopy Indications:              Hematochezia, Heme positive stool Medicines:                Monitored Anesthesia Care Procedure:                Pre-Anesthesia Assessment:                           - Prior to the procedure, a History and Physical                            was performed, and patient medications and                            allergies were reviewed. The patient's tolerance of                            previous anesthesia was also reviewed. The risks                            and benefits of the procedure and the sedation                            options and risks were discussed with the patient.                            All questions were answered, and informed consent                            was obtained. Prior Anticoagulants: The patient has                            taken no previous anticoagulant or antiplatelet                            agents. ASA Grade Assessment: II - A patient with                            mild systemic disease. After reviewing the risks                            and benefits, the patient was deemed in                            satisfactory condition to undergo the procedure.                           After obtaining informed consent, the colonoscope  was passed under direct vision. Throughout the                            procedure, the patient's blood pressure, pulse, and                            oxygen saturations were monitored continuously. The                            Model PCF-H190DL [id number]) scope was introduced                            through the anus and advanced to the the cecum,                            identified by appendiceal  orifice and ileocecal                            valve. The ileocecal valve, appendiceal orifice,                            and rectum were photographed. The quality of the                            bowel preparation was excellent. The colonoscopy                            was performed without difficulty. The patient                            tolerated the procedure well. Scope In: 8:38:29 AM Scope Out: 8:50:31 AM Scope Withdrawal Time: 0 hours 10 minutes 11 seconds  Total Procedure Duration: 0 hours 12 minutes 2 seconds  Findings:                 The perianal and digital rectal examinations were                            normal.                           A 5 mm polyp was found in the descending colon. The                            polyp was sessile. The polyp was removed with a                            cold snare. Resection and retrieval were complete.                           Internal hemorrhoids were found during                            retroflexion. The hemorrhoids were small and Grade  I (internal hemorrhoids that do not prolapse).                           The exam was otherwise without abnormality on                            direct and retroflexion views. Complications:            No immediate complications. Estimated blood loss:                            None. Estimated Blood Loss:     Estimated blood loss: none. Impression:               - One 5 mm polyp in the descending colon, removed                            with a cold snare. Resected and retrieved.                           - Internal hemorrhoids.                           - The examination was otherwise normal on direct                            and retroflexion views. Recommendation:           - Repeat colonoscopy in 5 years for surveillance if                            polyp is precancerous otherwise 10 years.                           - Patient has a contact number  available for                            emergencies. The signs and symptoms of potential                            delayed complications were discussed with the                            patient. Return to normal activities tomorrow.                            Written discharge instructions were provided to the                            patient.                           - Resume previous diet.                           - Continue present medications.                           -  Await pathology results. Ladene Artist, MD 12/16/2016 8:53:38 AM This report has been signed electronically.

## 2016-12-16 NOTE — Patient Instructions (Signed)
YOU HAD AN ENDOSCOPIC PROCEDURE TODAY AT THE [facility]:   Refer to the procedure report that was given to you for any specific questions about what was found during the examination.  If the procedure report does not answer your questions, please call your gastroenterologist to clarify.  If you requested that your care partner not be given the details of your procedure findings, then the procedure report has been included in a sealed envelope for you to review at your convenience later.  YOU SHOULD EXPECT: Some feelings of bloating in the abdomen. Passage of more gas than usual.  Walking can help get rid of the air that was put into your GI tract during the procedure and reduce the bloating. If you had a lower endoscopy (such as a colonoscopy or flexible sigmoidoscopy) you may notice spotting of blood in your stool or on the toilet paper. If you underwent a bowel prep for your procedure, you may not have a normal bowel movement for a few days.  Please Note:  You might notice some irritation and congestion in your nose or some drainage.  This is from the oxygen used during your procedure.  There is no need for concern and it should clear up in a day or so.  SYMPTOMS TO REPORT IMMEDIATELY:   Following lower endoscopy (colonoscopy or flexible sigmoidoscopy):  Excessive amounts of blood in the stool  Significant tenderness or worsening of abdominal pains  Swelling of the abdomen that is new, acute  Fever of 100F or higher    For urgent or emergent issues, a gastroenterologist can be reached at any hour by calling [phone].   DIET:  We do recommend a small meal at first, but then you may proceed to your regular diet.  Drink plenty of fluids but you should avoid alcoholic beverages for 24 hours.  ACTIVITY:  You should plan to take it easy for the rest of today and you should NOT DRIVE or use heavy machinery until tomorrow (because of the sedation medicines used during the test).     FOLLOW UP: Our staff will call the number listed on your records the next business day following your procedure to check on you and address any questions or concerns that you may have regarding the information given to you following your procedure. If we do not reach you, we will leave a message.  However, if you are feeling well and you are not experiencing any problems, there is no need to return our call.  We will assume that you have returned to your regular daily activities without incident.  If any biopsies were taken you will be contacted by phone or by letter within the next 1-3 weeks.  Please call us at [phone] if you have not heard about the biopsies in 3 weeks.    SIGNATURES/CONFIDENTIALITY: You and/or your care partner have signed paperwork which will be entered into your electronic medical record.  These signatures attest to the fact that that the information above on your After Visit Summary has been reviewed and is understood.  Full responsibility of the confidentiality of this discharge information lies with you and/or your care-partner.   Information on polyps and hemorrhoids given to you today.  

## 2016-12-16 NOTE — Progress Notes (Signed)
Called to room to assist during endoscopic procedure.  Patient ID and intended procedure confirmed with present staff. Received instructions for my participation in the procedure from the performing physician.  

## 2016-12-16 NOTE — Progress Notes (Signed)
Report to PACU, RN, vss, BBS= Clear.  

## 2016-12-17 ENCOUNTER — Telehealth: Payer: Self-pay | Admitting: *Deleted

## 2016-12-17 NOTE — Telephone Encounter (Signed)
  Follow up Call-  Call back number 12/16/2016  Post procedure Call Back phone  # [phone]  Permission to leave phone message Yes     Patient questions:  Do you have a fever, pain , or abdominal swelling? No. Pain Score  0 *  Have you tolerated food without any problems? Yes.    Have you been able to return to your normal activities? Yes.    Do you have any questions about your discharge instructions: Diet   No. Medications  No. Follow up visit  No.  Do you have questions or concerns about your Care? No.  Actions: * If pain score is 4 or above: No action needed, pain <4.

## 2016-12-21 ENCOUNTER — Other Ambulatory Visit (INDEPENDENT_AMBULATORY_CARE_PROVIDER_SITE_OTHER): Payer: Medicare Other

## 2016-12-21 ENCOUNTER — Other Ambulatory Visit: Payer: Self-pay | Admitting: Emergency Medicine

## 2016-12-21 DIAGNOSIS — E785 Hyperlipidemia, unspecified: Secondary | ICD-10-CM | POA: Diagnosis not present

## 2016-12-21 LAB — LIPID PANEL
CHOLESTEROL: 148 mg/dL (ref 0–200)
HDL: 44.4 mg/dL (ref >39.00)
LDL CALC: 82 mg/dL (ref 0–99)
NonHDL: 103.77
Total CHOL/HDL Ratio: 3
Triglycerides: 109 mg/dL (ref 0.0–149.0)
VLDL: 21.8 mg/dL (ref 0.0–40.0)

## 2016-12-21 NOTE — Telephone Encounter (Signed)
Patient did call back on 3-36-18 to make a lab appt for today at 1100 to have Lipid panel drawn... KMP... Per Dr.Lowne

## 2016-12-27 ENCOUNTER — Other Ambulatory Visit: Payer: Self-pay | Admitting: Family Medicine

## 2016-12-27 DIAGNOSIS — E785 Hyperlipidemia, unspecified: Secondary | ICD-10-CM

## 2016-12-30 ENCOUNTER — Encounter: Payer: Self-pay | Admitting: Gastroenterology

## 2017-01-17 ENCOUNTER — Encounter: Payer: Self-pay | Admitting: Family Medicine

## 2017-01-17 MED ORDER — FENOFIBRATE 160 MG PO TABS: 160.0000 mg | ORAL_TABLET | Freq: Every day | ORAL | 3 refills | Status: DC

## 2017-02-21 ENCOUNTER — Ambulatory Visit (INDEPENDENT_AMBULATORY_CARE_PROVIDER_SITE_OTHER): Payer: Medicare Other | Admitting: Family Medicine

## 2017-02-21 ENCOUNTER — Encounter: Payer: Self-pay | Admitting: Family Medicine

## 2017-02-21 VITALS — BP 133/68 | HR 76 | Temp 98.2°F | Resp 16 | Ht 70.0 in | Wt 209.2 lb

## 2017-02-21 DIAGNOSIS — E785 Hyperlipidemia, unspecified: Secondary | ICD-10-CM

## 2017-02-21 DIAGNOSIS — I10 Essential (primary) hypertension: Secondary | ICD-10-CM

## 2017-02-21 NOTE — Patient Instructions (Signed)

## 2017-02-21 NOTE — Assessment & Plan Note (Signed)
Well controlled, no changes to meds. Encouraged heart healthy diet such as the DASH diet and exercise as tolerated.  °

## 2017-02-21 NOTE — Assessment & Plan Note (Signed)
Tolerating statin, encouraged heart healthy diet, avoid trans fats, minimize simple carbs and saturated fats. Increase exercise as tolerated 

## 2017-02-21 NOTE — Progress Notes (Signed)
Pre visit review using our clinic review tool, if applicable. No additional management support is needed unless otherwise documented below in the visit note. 

## 2017-02-21 NOTE — Progress Notes (Signed)
Patient ID: David Nielsen, male   DOB: April 08, 1951, 66 y.o.   MRN: [id number]     Subjective:    Patient ID: David Nielsen, male    DOB: 07/15/1951, 67 y.o.   MRN: [id number]  Chief Complaint  Patient presents with  . Hypertension  . Hyperlipidemia    HPI Patient is in today for follow up blood pressure and cholesterol.  Blood pressures have been stable since blood pressure.    Past Medical History:  Diagnosis Date  . Chicken pox   . GERD (gastroesophageal reflux disease)   . H/O gastroesophageal reflux (GERD)   . HTN (hypertension)   . Hyperlipidemia   . PVC's (premature ventricular contractions)     Past Surgical History:  Procedure Laterality Date  . COLONOSCOPY    . none      Family History  Problem Relation Age of Onset  . Alcohol abuse Father 80    pneumonia  . Stroke Mother     Smoker  . Cancer Sister     breast  . Colon polyps Brother   . Alcohol abuse Son   . Stomach cancer Neg Hx   . Rectal cancer Neg Hx   . Esophageal cancer Neg Hx   . Liver cancer Neg Hx   . Colon cancer Neg Hx     Social History   Social History  . Marital status: Married    Spouse name: N/A  . Number of children: 2  . Years of education: N/A   Occupational History  . 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  [employer]   Social History Main Topics  . Smoking status: Never Smoker  . Smokeless tobacco: Never Used  . Alcohol use 0.0 oz/week     Comment: rare -- wine -- weekend  . Drug use: No  . Sexual activity: Yes    Partners: Female, Male   Other Topics Concern  . Not on file   Social History Narrative   Exercise-- 3x a week on total gym and treadmill    Outpatient Medications Prior to Visit  Medication Sig Dispense Refill  . aspirin EC 81 MG tablet Take 81 mg by mouth daily.    Mariane Baumgarten Sodium (COLACE PO) Take by mouth daily.    . fenofibrate 160 MG tablet Take 1 tablet (160 mg total) by mouth daily. 90 tablet 3  .  gabapentin (NEURONTIN) 300 MG capsule TAKE 1 CAPSULE AT BEDTIME 90 capsule 1  . Omega-3 Fatty Acids (FISH OIL) 1000 MG CAPS Take by mouth daily.    Marland Kitchen omeprazole (PRILOSEC) 20 MG capsule Take 20 mg by mouth daily.    . simvastatin (ZOCOR) 20 MG tablet Take 1 tablet (20 mg total) by mouth daily. 90 tablet 1  . valsartan-hydrochlorothiazide (DIOVAN-HCT) 80-12.5 MG tablet Take 1 tablet by mouth daily. 90 tablet 1   Facility-Administered Medications Prior to Visit  Medication Dose Route Frequency Provider Last Rate Last Dose  . 0.9 %  sodium chloride infusion  500 mL Intravenous Continuous Ladene Artist, MD        No Known Allergies  Review of Systems  Constitutional: Negative for fever and malaise/fatigue.  HENT: Negative for congestion.   Eyes: Negative for blurred vision.  Respiratory: Negative for cough and shortness of breath.   Cardiovascular: Negative for chest pain, palpitations and leg swelling.  Gastrointestinal: Negative for vomiting.  Musculoskeletal: Negative for back pain.  Skin: Negative for rash.  Neurological: Negative for loss of consciousness and headaches.       Objective:    Physical Exam  Constitutional: He appears well-developed and well-nourished. No distress.  HENT:  Head: Normocephalic and atraumatic.  Eyes: Conjunctivae are normal.  Neck: Normal range of motion. No thyromegaly present.  Cardiovascular: Normal rate and regular rhythm.   Pulmonary/Chest: Effort normal. He has no wheezes.  Abdominal: Soft. Bowel sounds are normal. There is no tenderness.  Musculoskeletal: Normal range of motion. He exhibits no edema or deformity.  Neurological: He is alert.  Skin: Skin is warm and dry. He is not diaphoretic.  Psychiatric: He has a normal mood and affect.    BP 133/68 (BP Location: Left Arm, Cuff Size: Normal)   Pulse 76   Temp 98.2 F (36.8 C) (Oral)   Resp 16   Ht 5\' 10"  (1.778 m)   Wt 209 lb 3.2 oz (94.9 kg)   SpO2 100%   BMI 30.02 kg/m  Wt  Readings from Last 3 Encounters:  02/21/17 209 lb 3.2 oz (94.9 kg)  12/16/16 212 lb (96.2 kg)  12/02/16 212 lb 3.2 oz (96.3 kg)     Lab Results  Component Value Date   WBC 5.9 08/20/2016   HGB 14.5 08/20/2016   HCT 41.8 08/20/2016   PLT 250.0 08/20/2016   GLUCOSE 99 12/07/2016   CHOL 148 12/21/2016   TRIG 109.0 12/21/2016   HDL 44.40 12/21/2016   LDLDIRECT 81.0 08/20/2016   LDLCALC 82 12/21/2016   ALT 38 12/07/2016   AST 35 12/07/2016   NA 137 12/07/2016   K 3.6 12/07/2016   CL 100 12/07/2016   CREATININE 1.15 12/07/2016   BUN 15 12/07/2016   CO2 32 12/07/2016   HGBA1C 6.3 12/07/2016    No results found for: TSH Lab Results  Component Value Date   WBC 5.9 08/20/2016   HGB 14.5 08/20/2016   HCT 41.8 08/20/2016   MCV 85.9 08/20/2016   PLT 250.0 08/20/2016   Lab Results  Component Value Date   NA 137 12/07/2016   K 3.6 12/07/2016   CO2 32 12/07/2016   GLUCOSE 99 12/07/2016   BUN 15 12/07/2016   CREATININE 1.15 12/07/2016   BILITOT 0.6 12/07/2016   ALKPHOS 26 (L) 12/07/2016   AST 35 12/07/2016   ALT 38 12/07/2016   PROT 7.0 12/07/2016   ALBUMIN 4.4 12/07/2016   CALCIUM 9.7 12/07/2016   GFR 67.49 12/07/2016   Lab Results  Component Value Date   CHOL 148 12/21/2016   Lab Results  Component Value Date   HDL 44.40 12/21/2016   Lab Results  Component Value Date   LDLCALC 82 12/21/2016   Lab Results  Component Value Date   TRIG 109.0 12/21/2016   Lab Results  Component Value Date   CHOLHDL 3 12/21/2016   Lab Results  Component Value Date   HGBA1C 6.3 12/07/2016       Assessment & Plan:   Problem List Items Addressed This Visit      Unprioritized   HTN (hypertension) - Primary    Well controlled, no changes to meds. Encouraged heart healthy diet such as the DASH diet and exercise as tolerated.       Hyperlipidemia    Tolerating statin, encouraged heart healthy diet, avoid trans fats, minimize simple carbs and saturated fats. Increase  exercise as tolerated       Other Visit Diagnoses    Hyperlipidemia LDL goal <100  I am having Mr. Decarlo maintain his gabapentin, valsartan-hydrochlorothiazide, simvastatin, omeprazole, Fish Oil, aspirin EC, Docusate Sodium (COLACE PO), and fenofibrate. We will continue to administer sodium chloride.  No orders of the defined types were placed in this encounter.    Ann Held, DO

## 2017-02-28 ENCOUNTER — Other Ambulatory Visit: Payer: Self-pay | Admitting: Family Medicine

## 2017-02-28 DIAGNOSIS — I10 Essential (primary) hypertension: Secondary | ICD-10-CM

## 2017-05-17 ENCOUNTER — Other Ambulatory Visit: Payer: Self-pay | Admitting: Family Medicine

## 2017-05-17 DIAGNOSIS — E785 Hyperlipidemia, unspecified: Secondary | ICD-10-CM

## 2017-06-16 ENCOUNTER — Telehealth: Payer: Self-pay | Admitting: Family Medicine

## 2017-06-16 NOTE — Telephone Encounter (Signed)
Spoke with pt to schedule AWV. Pt stated that he will have to give office a call back to schedule his appt.  *Pt can schedule AWV after 02/16/2017. SF

## 2017-08-25 ENCOUNTER — Ambulatory Visit: Payer: Medicare Other | Admitting: Family Medicine

## 2017-08-28 ENCOUNTER — Other Ambulatory Visit: Payer: Self-pay | Admitting: Family Medicine

## 2017-08-28 DIAGNOSIS — I10 Essential (primary) hypertension: Secondary | ICD-10-CM

## 2017-10-05 ENCOUNTER — Encounter: Payer: Self-pay | Admitting: Family Medicine

## 2017-10-06 MED ORDER — AMLODIPINE BESYLATE 5 MG PO TABS: 5.0000 mg | ORAL_TABLET | Freq: Every day | ORAL | 0 refills | Status: DC

## 2017-10-06 NOTE — Telephone Encounter (Signed)
Did pharmacy tell him his med was on the recall list? I'll change it but if the pharmacy did not tell him his was on the recall list it does not need to be.  Only certain generic companies were affected. D/c and call in norvasc 5 mg #30 1 po qd, 2 refills Need bp check 2-3 weeks

## 2017-10-28 ENCOUNTER — Other Ambulatory Visit: Payer: Self-pay | Admitting: Family Medicine

## 2017-10-28 DIAGNOSIS — E785 Hyperlipidemia, unspecified: Secondary | ICD-10-CM

## 2017-12-14 ENCOUNTER — Other Ambulatory Visit: Payer: Self-pay | Admitting: Family Medicine

## 2018-01-03 DIAGNOSIS — D229 Melanocytic nevi, unspecified: Secondary | ICD-10-CM | POA: Diagnosis not present

## 2018-01-03 DIAGNOSIS — L728 Other follicular cysts of the skin and subcutaneous tissue: Secondary | ICD-10-CM | POA: Diagnosis not present

## 2018-01-03 DIAGNOSIS — L821 Other seborrheic keratosis: Secondary | ICD-10-CM | POA: Diagnosis not present

## 2018-02-10 ENCOUNTER — Encounter

## 2018-02-10 ENCOUNTER — Encounter: Payer: Self-pay | Admitting: Family Medicine

## 2018-02-10 ENCOUNTER — Ambulatory Visit (INDEPENDENT_AMBULATORY_CARE_PROVIDER_SITE_OTHER): Payer: Medicare Other | Admitting: Family Medicine

## 2018-02-10 VITALS — BP 128/78 | HR 74 | Resp 16 | Ht 70.0 in | Wt 210.0 lb

## 2018-02-10 DIAGNOSIS — E785 Hyperlipidemia, unspecified: Secondary | ICD-10-CM | POA: Diagnosis not present

## 2018-02-10 DIAGNOSIS — I10 Essential (primary) hypertension: Secondary | ICD-10-CM

## 2018-02-10 DIAGNOSIS — Z23 Encounter for immunization: Secondary | ICD-10-CM | POA: Diagnosis not present

## 2018-02-10 DIAGNOSIS — R351 Nocturia: Secondary | ICD-10-CM | POA: Diagnosis not present

## 2018-02-10 LAB — LIPID PANEL
Cholesterol: 127 mg/dL (ref 0–200)
HDL: 46.9 mg/dL (ref >39.00)
LDL CALC: 66 mg/dL (ref 0–99)
NONHDL: 80.36
Total CHOL/HDL Ratio: 3
Triglycerides: 71 mg/dL (ref 0.0–149.0)
VLDL: 14.2 mg/dL (ref 0.0–40.0)

## 2018-02-10 LAB — COMPREHENSIVE METABOLIC PANEL
ALBUMIN: 4.3 g/dL (ref 3.5–5.2)
ALK PHOS: 30 U/L — AB (ref 39–117)
ALT: 28 U/L (ref 0–53)
AST: 29 U/L (ref 0–37)
BILIRUBIN TOTAL: 0.7 mg/dL (ref 0.2–1.2)
BUN: 16 mg/dL (ref 6–23)
CALCIUM: 9.8 mg/dL (ref 8.4–10.5)
CO2: 30 mEq/L (ref 19–32)
CREATININE: 1.13 mg/dL (ref 0.40–1.50)
Chloride: 101 mEq/L (ref 96–112)
GFR: 68.62 mL/min (ref >60.00)
Glucose, Bld: 83 mg/dL (ref 70–99)
Potassium: 4 mEq/L (ref 3.5–5.1)
SODIUM: 140 meq/L (ref 135–145)
TOTAL PROTEIN: 6.6 g/dL (ref 6.0–8.3)

## 2018-02-10 LAB — CBC WITH DIFFERENTIAL/PLATELET
Basophils Absolute: 0.1 10*3/uL (ref 0.0–0.1)
Basophils Relative: 1.3 % (ref 0.0–3.0)
EOS ABS: 0.3 10*3/uL (ref 0.0–0.7)
Eosinophils Relative: 5.8 % — ABNORMAL HIGH (ref 0.0–5.0)
HEMATOCRIT: 39.2 % (ref 39.0–52.0)
HEMOGLOBIN: 13.5 g/dL (ref 13.0–17.0)
LYMPHS PCT: 22.1 % (ref 12.0–46.0)
Lymphs Abs: 1.3 10*3/uL (ref 0.7–4.0)
MCHC: 34.4 g/dL (ref 30.0–36.0)
MCV: 86.5 fl (ref 78.0–100.0)
MONO ABS: 0.7 10*3/uL (ref 0.1–1.0)
Monocytes Relative: 11.5 % (ref 3.0–12.0)
Neutro Abs: 3.6 10*3/uL (ref 1.4–7.7)
Neutrophils Relative %: 59.3 % (ref 43.0–77.0)
Platelets: 277 10*3/uL (ref 150.0–400.0)
RBC: 4.54 Mil/uL (ref 4.22–5.81)
RDW: 13.2 % (ref 11.5–15.5)
WBC: 6 10*3/uL (ref 4.0–10.5)

## 2018-02-10 LAB — PSA: PSA: 1.24 ng/mL (ref 0.10–4.00)

## 2018-02-10 NOTE — Patient Instructions (Signed)

## 2018-02-10 NOTE — Progress Notes (Signed)
Patient ID: David Nielsen, male    DOB: 1951-04-27  Age: 67 y.o. MRN: [id number]    Subjective:  Subjective  HPI++ David Nielsen presents for f/u bp and cholesterol.   His brother was just dx with a prostate problem-- not cancer -- he said his psa was elevated.    Review of Systems  Constitutional: Negative for appetite change, diaphoresis, fatigue and unexpected weight change.  HENT: Negative.   Eyes: Negative for pain, redness and visual disturbance.  Respiratory: Negative for cough, chest tightness, shortness of breath and wheezing.   Cardiovascular: Negative for chest pain, palpitations and leg swelling.  Gastrointestinal: Negative.   Endocrine: Negative for cold intolerance, heat intolerance, polydipsia, polyphagia and polyuria.  Genitourinary: Positive for frequency. Negative for difficulty urinating and dysuria.  Allergic/Immunologic: Negative.   Neurological: Negative.  Negative for dizziness, light-headedness, numbness and headaches.  Hematological: Negative.   Psychiatric/Behavioral: Negative.     History Past Medical History:  Diagnosis Date  . Chicken pox   . GERD (gastroesophageal reflux disease)   . H/O gastroesophageal reflux (GERD)   . HTN (hypertension)   . Hyperlipidemia   . PVC's (premature ventricular contractions)     He has a past surgical history that includes none and Colonoscopy.   His family history includes Alcohol abuse in his son; Alcohol abuse (age of onset: 51) in his father; Cancer in his sister; Colon polyps in his brother; Stroke in his mother.He reports that he has never smoked. He has never used smokeless tobacco. He reports that he drinks alcohol. He reports that he does not use drugs.  Current Outpatient Medications on File Prior to Visit  Medication Sig Dispense Refill  . amLODipine (NORVASC) 5 MG tablet TAKE 1 TABLET DAILY 90 tablet 0  . aspirin EC 81 MG tablet Take 81 mg by mouth daily.    Mariane Baumgarten Sodium (COLACE PO) Take by  mouth daily.    . fenofibrate 160 MG tablet TAKE 1 TABLET DAILY 90 tablet 3  . gabapentin (NEURONTIN) 300 MG capsule TAKE 1 CAPSULE AT BEDTIME 90 capsule 1  . ibuprofen (ADVIL) 200 MG tablet     . Omega-3 Fatty Acids (FISH OIL) 1000 MG CAPS Take by mouth daily.    Marland Kitchen omeprazole (PRILOSEC) 20 MG capsule Take 20 mg by mouth daily.    . simvastatin (ZOCOR) 20 MG tablet TAKE 1 TABLET DAILY 90 tablet 1   Current Facility-Administered Medications on File Prior to Visit  Medication Dose Route Frequency Provider Last Rate Last Dose  . 0.9 %  sodium chloride infusion  500 mL Intravenous Continuous Ladene Artist, MD         Objective:  Objective  Physical Exam  Constitutional: He is oriented to person, place, and time. Vital signs are normal. He appears well-developed and well-nourished. He is sleeping.  HENT:  Head: Normocephalic and atraumatic.  Mouth/Throat: Oropharynx is clear and moist.  Eyes: Pupils are equal, round, and reactive to light. EOM are normal.  Neck: Normal range of motion. Neck supple. No thyromegaly present.  Cardiovascular: Normal rate and regular rhythm.  No murmur heard. Pulmonary/Chest: Effort normal and breath sounds normal. No respiratory distress. He has no wheezes. He has no rales. He exhibits no tenderness.  Musculoskeletal: He exhibits no edema or tenderness.  Neurological: He is alert and oriented to person, place, and time.  Skin: Skin is warm and dry.  Psychiatric: He has a normal mood and affect. His behavior is normal. Judgment and thought  content normal.  Nursing note and vitals reviewed.  BP 128/78 (BP Location: Right Arm, Patient Position: Sitting, Cuff Size: Large)   Pulse 74   Resp 16   Ht 5\' 10"  (1.778 m)   Wt 210 lb (95.3 kg)   SpO2 99%   BMI 30.13 kg/m  Wt Readings from Last 3 Encounters:  02/10/18 210 lb (95.3 kg)  02/21/17 209 lb 3.2 oz (94.9 kg)  12/16/16 212 lb (96.2 kg)     Lab Results  Component Value Date   WBC 6.0 02/10/2018     HGB 13.5 02/10/2018   HCT 39.2 02/10/2018   PLT 277.0 02/10/2018   GLUCOSE 83 02/10/2018   CHOL 127 02/10/2018   TRIG 71.0 02/10/2018   HDL 46.90 02/10/2018   LDLDIRECT 81.0 08/20/2016   LDLCALC 66 02/10/2018   ALT 28 02/10/2018   AST 29 02/10/2018   NA 140 02/10/2018   K 4.0 02/10/2018   CL 101 02/10/2018   CREATININE 1.13 02/10/2018   BUN 16 02/10/2018   CO2 30 02/10/2018   PSA 1.24 02/10/2018   HGBA1C 6.3 12/07/2016    Patient was never admitted.   Assessment & Plan:  Plan  I am having Vanetta Mulders maintain his gabapentin, omeprazole, Fish Oil, aspirin EC, Docusate Sodium (COLACE PO), fenofibrate, simvastatin, amLODipine, and ibuprofen. We will continue to administer sodium chloride.  No orders of the defined types were placed in this encounter.   Problem List Items Addressed This Visit      Unprioritized   HTN (hypertension) - Primary    Well controlled, no changes to meds. Encouraged heart healthy diet such as the DASH diet and exercise as tolerated.       Relevant Orders   Lipid panel (Completed)   CBC with Differential/Platelet (Completed)   Comprehensive metabolic panel (Completed)   Hyperlipidemia LDL goal <100    Tolerating statin, encouraged heart healthy diet, avoid trans fats, minimize simple carbs and saturated fats. Increase exercise as tolerated      Relevant Orders   Lipid panel (Completed)   CBC with Differential/Platelet (Completed)   Comprehensive metabolic panel (Completed)   Nocturia    Pt did not want to have a prostate check--- he just wanted the blood test      Relevant Orders   PSA (Completed)    Other Visit Diagnoses    Need for Tdap vaccination       Relevant Orders   Tdap vaccine greater than or equal to 7yo IM (Completed)      Follow-up: Return in about 6 months (around 08/12/2018).  Ann Held, DO

## 2018-02-12 DIAGNOSIS — R351 Nocturia: Secondary | ICD-10-CM | POA: Insufficient documentation

## 2018-02-12 NOTE — Assessment & Plan Note (Signed)
Well controlled, no changes to meds. Encouraged heart healthy diet such as the DASH diet and exercise as tolerated.  °

## 2018-02-12 NOTE — Assessment & Plan Note (Signed)
Tolerating statin, encouraged heart healthy diet, avoid trans fats, minimize simple carbs and saturated fats. Increase exercise as tolerated 

## 2018-02-12 NOTE — Assessment & Plan Note (Signed)
Pt did not want to have a prostate check--- he just wanted the blood test

## 2018-02-27 ENCOUNTER — Other Ambulatory Visit: Payer: Self-pay | Admitting: Family Medicine

## 2018-04-05 DIAGNOSIS — H43393 Other vitreous opacities, bilateral: Secondary | ICD-10-CM | POA: Diagnosis not present

## 2018-04-05 DIAGNOSIS — H43811 Vitreous degeneration, right eye: Secondary | ICD-10-CM | POA: Diagnosis not present

## 2018-04-12 ENCOUNTER — Encounter (INDEPENDENT_AMBULATORY_CARE_PROVIDER_SITE_OTHER): Payer: Medicare Other | Admitting: Ophthalmology

## 2018-04-12 ENCOUNTER — Encounter (INDEPENDENT_AMBULATORY_CARE_PROVIDER_SITE_OTHER): Payer: Self-pay | Admitting: Ophthalmology

## 2018-04-12 DIAGNOSIS — I10 Essential (primary) hypertension: Secondary | ICD-10-CM | POA: Diagnosis not present

## 2018-04-12 DIAGNOSIS — H3561 Retinal hemorrhage, right eye: Secondary | ICD-10-CM

## 2018-04-12 DIAGNOSIS — H2513 Age-related nuclear cataract, bilateral: Secondary | ICD-10-CM | POA: Diagnosis not present

## 2018-04-12 DIAGNOSIS — H35033 Hypertensive retinopathy, bilateral: Secondary | ICD-10-CM | POA: Diagnosis not present

## 2018-04-12 DIAGNOSIS — H43813 Vitreous degeneration, bilateral: Secondary | ICD-10-CM | POA: Diagnosis not present

## 2018-05-24 ENCOUNTER — Encounter (INDEPENDENT_AMBULATORY_CARE_PROVIDER_SITE_OTHER): Payer: Medicare Other | Admitting: Ophthalmology

## 2018-05-24 DIAGNOSIS — I10 Essential (primary) hypertension: Secondary | ICD-10-CM | POA: Diagnosis not present

## 2018-05-24 DIAGNOSIS — H35033 Hypertensive retinopathy, bilateral: Secondary | ICD-10-CM

## 2018-05-24 DIAGNOSIS — H3413 Central retinal artery occlusion, bilateral: Secondary | ICD-10-CM | POA: Diagnosis not present

## 2018-05-24 DIAGNOSIS — H2513 Age-related nuclear cataract, bilateral: Secondary | ICD-10-CM

## 2018-06-27 ENCOUNTER — Other Ambulatory Visit: Payer: Self-pay | Admitting: Family Medicine

## 2018-06-27 DIAGNOSIS — E785 Hyperlipidemia, unspecified: Secondary | ICD-10-CM

## 2018-08-08 ENCOUNTER — Encounter: Payer: Self-pay | Admitting: Family Medicine

## 2018-08-08 ENCOUNTER — Ambulatory Visit (INDEPENDENT_AMBULATORY_CARE_PROVIDER_SITE_OTHER): Payer: Medicare Other | Admitting: Family Medicine

## 2018-08-08 VITALS — BP 136/78 | HR 65 | Temp 97.9°F | Resp 16 | Ht 70.0 in | Wt 209.6 lb

## 2018-08-08 DIAGNOSIS — E785 Hyperlipidemia, unspecified: Secondary | ICD-10-CM | POA: Diagnosis not present

## 2018-08-08 DIAGNOSIS — H6501 Acute serous otitis media, right ear: Secondary | ICD-10-CM | POA: Diagnosis not present

## 2018-08-08 DIAGNOSIS — M542 Cervicalgia: Secondary | ICD-10-CM

## 2018-08-08 MED ORDER — FLUTICASONE PROPIONATE 50 MCG/ACT NA SUSP: 2.0000 | Freq: Every day | NASAL | 6 refills | Status: DC

## 2018-08-08 MED ORDER — METHOCARBAMOL 500 MG PO TABS: ORAL_TABLET | ORAL | 1 refills | Status: DC

## 2018-08-08 MED ORDER — LEVOCETIRIZINE DIHYDROCHLORIDE 5 MG PO TABS: 5.0000 mg | ORAL_TABLET | Freq: Every evening | ORAL | 5 refills | Status: DC

## 2018-08-08 NOTE — Patient Instructions (Signed)

## 2018-08-08 NOTE — Progress Notes (Signed)
Patient ID: David Nielsen, male    DOB: Jan 06, 1951  Age: 67 y.o. MRN: [id number]    Subjective:  Subjective  HPI David Nielsen presents for pain in R side of neck with muscle spasms and R ear hurts as well. It feels clogged   Review of Systems  Constitutional: Negative for appetite change, diaphoresis, fatigue and unexpected weight change.  HENT: Positive for postnasal drip and rhinorrhea. Negative for sinus pressure, sinus pain and sneezing.   Eyes: Negative for pain, redness and visual disturbance.  Respiratory: Negative for cough, chest tightness, shortness of breath and wheezing.   Cardiovascular: Negative for chest pain, palpitations and leg swelling.  Endocrine: Negative for cold intolerance, heat intolerance, polydipsia, polyphagia and polyuria.  Genitourinary: Negative for difficulty urinating, dysuria and frequency.  Neurological: Negative for dizziness, light-headedness, numbness and headaches.    History Past Medical History:  Diagnosis Date  . Chicken pox   . GERD (gastroesophageal reflux disease)   . H/O gastroesophageal reflux (GERD)   . HTN (hypertension)   . Hyperlipidemia   . PVC's (premature ventricular contractions)     He has a past surgical history that includes none and Colonoscopy.   His family history includes Alcohol abuse in his son; Alcohol abuse (age of onset: 58) in his father; Cancer in his sister; Colon polyps in his brother; Stroke in his mother.He reports that he has never smoked. He has never used smokeless tobacco. He reports that he drinks alcohol. He reports that he does not use drugs.  Current Outpatient Medications on File Prior to Visit  Medication Sig Dispense Refill  . amLODipine (NORVASC) 5 MG tablet TAKE 1 TABLET DAILY 90 tablet 1  . aspirin EC 81 MG tablet Take 81 mg by mouth daily.    Mariane Baumgarten Sodium (COLACE PO) Take by mouth daily.    . fenofibrate 160 MG tablet TAKE 1 TABLET DAILY 90 tablet 3  . ibuprofen (ADVIL) 200 MG  tablet     . Omega-3 Fatty Acids (FISH OIL) 1000 MG CAPS Take by mouth daily.    Marland Kitchen omeprazole (PRILOSEC) 20 MG capsule Take 20 mg by mouth daily.    . simvastatin (ZOCOR) 20 MG tablet TAKE 1 TABLET DAILY 90 tablet 0   Current Facility-Administered Medications on File Prior to Visit  Medication Dose Route Frequency Provider Last Rate Last Dose  . 0.9 %  sodium chloride infusion  500 mL Intravenous Continuous Ladene Artist, MD         Objective:  Objective  Physical Exam  Constitutional: He is oriented to person, place, and time. Vital signs are normal. He appears well-developed and well-nourished. He is sleeping.  HENT:  Head: Normocephalic and atraumatic.  Right Ear: There is tenderness. A middle ear effusion is present.  Mouth/Throat: Oropharynx is clear and moist.  Eyes: Pupils are equal, round, and reactive to light. EOM are normal.  Neck: Normal range of motion. Neck supple. No thyromegaly present.    Cardiovascular: Normal rate and regular rhythm.  No murmur heard. Pulmonary/Chest: Effort normal and breath sounds normal. No respiratory distress. He has no wheezes. He has no rales. He exhibits no tenderness.  Musculoskeletal: He exhibits no edema or tenderness.  Neurological: He is alert and oriented to person, place, and time.  Skin: Skin is warm and dry.  Psychiatric: He has a normal mood and affect. His behavior is normal. Judgment and thought content normal.  Nursing note and vitals reviewed.  BP 136/78 (BP Location: Right Arm,  Cuff Size: Large)   Pulse 65   Temp 97.9 F (36.6 C) (Oral)   Resp 16   Ht 5\' 10"  (1.778 m)   Wt 209 lb 9.6 oz (95.1 kg)   SpO2 97%   BMI 30.07 kg/m  Wt Readings from Last 3 Encounters:  08/08/18 209 lb 9.6 oz (95.1 kg)  02/10/18 210 lb (95.3 kg)  02/21/17 209 lb 3.2 oz (94.9 kg)     Lab Results  Component Value Date   WBC 6.0 02/10/2018   HGB 13.5 02/10/2018   HCT 39.2 02/10/2018   PLT 277.0 02/10/2018   GLUCOSE 83 02/10/2018     CHOL 127 02/10/2018   TRIG 71.0 02/10/2018   HDL 46.90 02/10/2018   LDLDIRECT 81.0 08/20/2016   LDLCALC 66 02/10/2018   ALT 28 02/10/2018   AST 29 02/10/2018   NA 140 02/10/2018   K 4.0 02/10/2018   CL 101 02/10/2018   CREATININE 1.13 02/10/2018   BUN 16 02/10/2018   CO2 30 02/10/2018   PSA 1.24 02/10/2018   HGBA1C 6.3 12/07/2016    Patient was never admitted.   Assessment & Plan:  Plan  I have discontinued Gwyndolyn Saxon Chestnutt's gabapentin. I am also having him start on fluticasone, levocetirizine, and methocarbamol. Additionally, I am having him maintain his omeprazole, Fish Oil, aspirin EC, Docusate Sodium (COLACE PO), fenofibrate, ibuprofen, amLODipine, and simvastatin. We will continue to administer sodium chloride.  Meds ordered this encounter  Medications  . fluticasone (FLONASE) 50 MCG/ACT nasal spray    Sig: Place 2 sprays into both nostrils daily.    Dispense:  16 g    Refill:  6  . levocetirizine (XYZAL) 5 MG tablet    Sig: Take 1 tablet (5 mg total) by mouth every evening.    Dispense:  30 tablet    Refill:  5  . methocarbamol (ROBAXIN) 500 MG tablet    Sig: 1-2 qid prn    Dispense:  30 tablet    Refill:  1    Problem List Items Addressed This Visit    None    Visit Diagnoses    Hyperlipidemia, unspecified hyperlipidemia type    -  Primary   Relevant Orders   Lipid panel   Neck pain       Relevant Medications   methocarbamol (ROBAXIN) 500 MG tablet   Other Relevant Orders   CBC with Differential/Platelet   Comprehensive metabolic panel   Right acute serous otitis media, recurrence not specified       Relevant Medications   fluticasone (FLONASE) 50 MCG/ACT nasal spray   levocetirizine (XYZAL) 5 MG tablet    rto or call if symptoms do not improve  Follow-up: Return in about 6 months (around 02/07/2019), or if symptoms worsen or fail to improve.  Ann Held, DO

## 2018-08-09 LAB — LIPID PANEL
CHOL/HDL RATIO: 3
CHOLESTEROL: 136 mg/dL (ref 0–200)
HDL: 41.8 mg/dL (ref >39.00)
LDL CALC: 63 mg/dL (ref 0–99)
NonHDL: 94.33
TRIGLYCERIDES: 155 mg/dL — AB (ref 0.0–149.0)
VLDL: 31 mg/dL (ref 0.0–40.0)

## 2018-08-09 LAB — COMPREHENSIVE METABOLIC PANEL
ALBUMIN: 4.5 g/dL (ref 3.5–5.2)
ALT: 25 U/L (ref 0–53)
AST: 25 U/L (ref 0–37)
Alkaline Phosphatase: 31 U/L — ABNORMAL LOW (ref 39–117)
BUN: 16 mg/dL (ref 6–23)
CALCIUM: 10 mg/dL (ref 8.4–10.5)
CHLORIDE: 101 meq/L (ref 96–112)
CO2: 32 meq/L (ref 19–32)
Creatinine, Ser: 1.18 mg/dL (ref 0.40–1.50)
GFR: 65.18 mL/min (ref >60.00)
Glucose, Bld: 98 mg/dL (ref 70–99)
POTASSIUM: 3.5 meq/L (ref 3.5–5.1)
SODIUM: 139 meq/L (ref 135–145)
Total Bilirubin: 0.5 mg/dL (ref 0.2–1.2)
Total Protein: 6.8 g/dL (ref 6.0–8.3)

## 2018-08-09 LAB — CBC WITH DIFFERENTIAL/PLATELET
Basophils Absolute: 0.1 10*3/uL (ref 0.0–0.1)
Basophils Relative: 2 % (ref 0.0–3.0)
EOS ABS: 0.3 10*3/uL (ref 0.0–0.7)
Eosinophils Relative: 6.6 % — ABNORMAL HIGH (ref 0.0–5.0)
HCT: 40.3 % (ref 39.0–52.0)
Hemoglobin: 13.9 g/dL (ref 13.0–17.0)
LYMPHS ABS: 1.2 10*3/uL (ref 0.7–4.0)
Lymphocytes Relative: 23.1 % (ref 12.0–46.0)
MCHC: 34.6 g/dL (ref 30.0–36.0)
MCV: 86.1 fl (ref 78.0–100.0)
MONO ABS: 0.6 10*3/uL (ref 0.1–1.0)
Monocytes Relative: 10.6 % (ref 3.0–12.0)
NEUTROS ABS: 3 10*3/uL (ref 1.4–7.7)
NEUTROS PCT: 57.7 % (ref 43.0–77.0)
PLATELETS: 284 10*3/uL (ref 150.0–400.0)
RBC: 4.68 Mil/uL (ref 4.22–5.81)
RDW: 13.2 % (ref 11.5–15.5)
WBC: 5.2 10*3/uL (ref 4.0–10.5)

## 2018-08-17 ENCOUNTER — Other Ambulatory Visit: Payer: Self-pay | Admitting: *Deleted

## 2018-08-17 DIAGNOSIS — I10 Essential (primary) hypertension: Secondary | ICD-10-CM

## 2018-08-17 DIAGNOSIS — E785 Hyperlipidemia, unspecified: Secondary | ICD-10-CM

## 2018-08-17 MED ORDER — SIMVASTATIN 40 MG PO TABS: 40.0000 mg | ORAL_TABLET | Freq: Every day | ORAL | 0 refills | Status: DC

## 2018-08-26 ENCOUNTER — Other Ambulatory Visit: Payer: Self-pay | Admitting: Family Medicine

## 2018-08-29 ENCOUNTER — Telehealth: Payer: Self-pay | Admitting: *Deleted

## 2018-08-29 MED ORDER — ATORVASTATIN CALCIUM 10 MG PO TABS: 10.0000 mg | ORAL_TABLET | Freq: Every day | ORAL | 1 refills | Status: DC

## 2018-08-29 NOTE — Telephone Encounter (Signed)
[facility] sent over a drug interaction review between amlodipine and simvastatin.    "Amlodipine can increase simvastatin concentrations and the risk of myopathy or rhabdomyolysis."  Rx sent in for lipitor 10mg  to take the place of simvastatin.   Left message on machine to call back.

## 2018-08-29 NOTE — Telephone Encounter (Signed)
FYI

## 2018-08-29 NOTE — Telephone Encounter (Signed)
noted 

## 2018-08-29 NOTE — Telephone Encounter (Signed)
Pt. returned call.  Advised of the information rec'd from [facility] re: the drug interaction between Amlodipine and Simvastatin.  Advised of plan to d/c Simvastatin and replace with Atorvastatin 10 mg. Daily.  Offered to order a week supply of Atorvastatin at local pharmacy, until the mail order arrives.  Pt. declined this, and stated he will hold the Simvastatin for now, until new medication arrives.

## 2018-08-29 NOTE — Telephone Encounter (Addendum)
Call placed to patient. Left VM to call office for message from Dr Carollee Herter.

## 2019-01-01 ENCOUNTER — Other Ambulatory Visit: Payer: Self-pay | Admitting: Family Medicine

## 2019-02-02 ENCOUNTER — Other Ambulatory Visit: Payer: Self-pay | Admitting: Family Medicine

## 2019-02-22 ENCOUNTER — Other Ambulatory Visit: Payer: Self-pay | Admitting: Family Medicine

## 2019-02-23 ENCOUNTER — Telehealth: Payer: Self-pay

## 2019-02-23 NOTE — Telephone Encounter (Signed)
Left message to have patient call back to set up virtual visit for med refills.

## 2019-03-29 ENCOUNTER — Other Ambulatory Visit: Payer: Self-pay

## 2019-03-29 ENCOUNTER — Encounter: Payer: Self-pay | Admitting: Family Medicine

## 2019-03-29 ENCOUNTER — Ambulatory Visit (INDEPENDENT_AMBULATORY_CARE_PROVIDER_SITE_OTHER): Payer: Medicare Other | Admitting: Family Medicine

## 2019-03-29 DIAGNOSIS — E785 Hyperlipidemia, unspecified: Secondary | ICD-10-CM | POA: Diagnosis not present

## 2019-03-29 DIAGNOSIS — I10 Essential (primary) hypertension: Secondary | ICD-10-CM

## 2019-03-29 MED ORDER — AMLODIPINE BESYLATE 5 MG PO TABS: 5.0000 mg | ORAL_TABLET | Freq: Every day | ORAL | 1 refills | Status: DC

## 2019-03-29 NOTE — Progress Notes (Signed)
Virtual Visit via Video Note  I connected with David Nielsen on 03/29/19 at  3:45 PM EDT by a video enabled telemedicine application and verified that I am speaking with the correct person using two identifiers.  Location: Patient: home  Provider: home    I discussed the limitations of evaluation and management by telemedicine and the availability of in person appointments. The patient expressed understanding and agreed to proceed.  History of Present Illness: Pt is home with no complaints  He need labs and f/u bp and cholesterol    Past Medical History:  Diagnosis Date  . Chicken pox   . GERD (gastroesophageal reflux disease)   . H/O gastroesophageal reflux (GERD)   . HTN (hypertension)   . Hyperlipidemia   . PVC's (premature ventricular contractions)    Current Outpatient Medications on File Prior to Visit  Medication Sig Dispense Refill  . aspirin EC 81 MG tablet Take 81 mg by mouth daily.    Marland Kitchen atorvastatin (LIPITOR) 10 MG tablet Take 1 tablet (10 mg total) by mouth daily. Please have pt to call for virtual f/u visit 90 tablet 0  . Docusate Sodium (COLACE PO) Take by mouth daily.    . fenofibrate 160 MG tablet TAKE 1 TABLET DAILY 90 tablet 1  . ibuprofen (ADVIL) 200 MG tablet     . Omega-3 Fatty Acids (FISH OIL) 1000 MG CAPS Take by mouth daily.    Marland Kitchen omeprazole (PRILOSEC) 20 MG capsule Take 20 mg by mouth daily.     Current Facility-Administered Medications on File Prior to Visit  Medication Dose Route Frequency Provider Last Rate Last Dose  . 0.9 %  sodium chloride infusion  500 mL Intravenous Continuous Ladene Artist, MD        Observations/Objective: .140/77  p 71  Afebrile   Wt 203  Temp 98 Pt is in NAD  Assessment and Plan: 1. Essential hypertension Well controlled, no changes to meds. Encouraged heart healthy diet such as the DASH diet and exercise as tolerated.   - Lipid panel; Future - Comprehensive metabolic panel; Future - amLODipine (NORVASC) 5 MG  tablet; Take 1 tablet (5 mg total) by mouth daily.  Dispense: 90 tablet; Refill: 1  2. Hyperlipidemia, unspecified hyperlipidemia type Encouraged heart healthy diet, increase exercise, avoid trans fats, consider a krill oil cap daily  - Lipid panel; Future - Comprehensive metabolic panel; Future   Follow Up Instructions:    I discussed the assessment and treatment plan with the patient. The patient was provided an opportunity to ask questions and all were answered. The patient agreed with the plan and demonstrated an understanding of the instructions.   The patient was advised to call back or seek an in-person evaluation if the symptoms worsen or if the condition fails to improve as anticipated.  I provided 15 minutes of non-face-to-face time during this encounter.   Ann Held, DO

## 2019-04-05 ENCOUNTER — Other Ambulatory Visit (INDEPENDENT_AMBULATORY_CARE_PROVIDER_SITE_OTHER): Payer: Medicare Other

## 2019-04-05 ENCOUNTER — Other Ambulatory Visit: Payer: Self-pay

## 2019-04-05 DIAGNOSIS — I10 Essential (primary) hypertension: Secondary | ICD-10-CM

## 2019-04-05 DIAGNOSIS — E785 Hyperlipidemia, unspecified: Secondary | ICD-10-CM | POA: Diagnosis not present

## 2019-04-05 LAB — LIPID PANEL
Cholesterol: 126 mg/dL (ref 0–200)
HDL: 45.7 mg/dL (ref >39.00)
LDL Cholesterol: 63 mg/dL (ref 0–99)
NonHDL: 80.5
Total CHOL/HDL Ratio: 3
Triglycerides: 90 mg/dL (ref 0.0–149.0)
VLDL: 18 mg/dL (ref 0.0–40.0)

## 2019-04-05 LAB — COMPREHENSIVE METABOLIC PANEL
ALT: 26 U/L (ref 0–53)
AST: 24 U/L (ref 0–37)
Albumin: 4.4 g/dL (ref 3.5–5.2)
Alkaline Phosphatase: 33 U/L — ABNORMAL LOW (ref 39–117)
BUN: 17 mg/dL (ref 6–23)
CO2: 32 mEq/L (ref 19–32)
Calcium: 10 mg/dL (ref 8.4–10.5)
Chloride: 101 mEq/L (ref 96–112)
Creatinine, Ser: 1.04 mg/dL (ref 0.40–1.50)
GFR: 70.81 mL/min (ref >60.00)
Glucose, Bld: 82 mg/dL (ref 70–99)
Potassium: 4.1 mEq/L (ref 3.5–5.1)
Sodium: 140 mEq/L (ref 135–145)
Total Bilirubin: 0.7 mg/dL (ref 0.2–1.2)
Total Protein: 6.6 g/dL (ref 6.0–8.3)

## 2019-05-02 ENCOUNTER — Other Ambulatory Visit: Payer: Self-pay | Admitting: Family Medicine

## 2019-05-02 MED ORDER — ATORVASTATIN CALCIUM 10 MG PO TABS: 10.0000 mg | ORAL_TABLET | Freq: Every day | ORAL | 1 refills | Status: DC

## 2019-05-16 ENCOUNTER — Other Ambulatory Visit: Payer: Self-pay

## 2019-06-18 ENCOUNTER — Other Ambulatory Visit: Payer: Self-pay | Admitting: Family Medicine

## 2019-06-28 ENCOUNTER — Ambulatory Visit (INDEPENDENT_AMBULATORY_CARE_PROVIDER_SITE_OTHER): Payer: Medicare Other

## 2019-06-28 ENCOUNTER — Other Ambulatory Visit: Payer: Self-pay

## 2019-06-28 ENCOUNTER — Encounter: Payer: Self-pay | Admitting: Family Medicine

## 2019-06-28 DIAGNOSIS — Z23 Encounter for immunization: Secondary | ICD-10-CM

## 2019-07-04 ENCOUNTER — Other Ambulatory Visit: Payer: Self-pay | Admitting: Dermatology

## 2019-07-04 DIAGNOSIS — D229 Melanocytic nevi, unspecified: Secondary | ICD-10-CM | POA: Diagnosis not present

## 2019-07-04 DIAGNOSIS — D485 Neoplasm of uncertain behavior of skin: Secondary | ICD-10-CM | POA: Diagnosis not present

## 2019-07-04 DIAGNOSIS — D3617 Benign neoplasm of peripheral nerves and autonomic nervous system of trunk, unspecified: Secondary | ICD-10-CM | POA: Diagnosis not present

## 2019-07-04 DIAGNOSIS — L821 Other seborrheic keratosis: Secondary | ICD-10-CM | POA: Diagnosis not present

## 2019-09-10 ENCOUNTER — Other Ambulatory Visit: Payer: Self-pay | Admitting: Family Medicine

## 2019-09-10 DIAGNOSIS — I10 Essential (primary) hypertension: Secondary | ICD-10-CM

## 2019-10-19 ENCOUNTER — Other Ambulatory Visit: Payer: Self-pay | Admitting: Family Medicine

## 2019-12-18 ENCOUNTER — Telehealth: Payer: Self-pay | Admitting: Family Medicine

## 2019-12-18 NOTE — Progress Notes (Signed)
  Chronic Care Management   Outreach Note  12/18/2019 Name: David Nielsen MRN: [id number] DOB: 09/22/1951  Referred by: Ann Held, DO Reason for referral : No chief complaint on file.   An unsuccessful telephone outreach was attempted today. The patient was referred to the pharmacist for assistance with care management and care coordination.   Follow Up Plan:   Raynicia Dukes [employer] Scheduler

## 2020-04-11 ENCOUNTER — Other Ambulatory Visit: Payer: Self-pay | Admitting: Family Medicine

## 2020-04-11 DIAGNOSIS — I10 Essential (primary) hypertension: Secondary | ICD-10-CM

## 2020-04-29 ENCOUNTER — Other Ambulatory Visit: Payer: Self-pay | Admitting: Family Medicine

## 2020-05-19 ENCOUNTER — Encounter: Payer: Self-pay | Admitting: Family Medicine

## 2020-05-20 NOTE — Telephone Encounter (Signed)
He needs an office visit-- has not been seen in over a year

## 2020-06-03 ENCOUNTER — Other Ambulatory Visit: Payer: Self-pay

## 2020-06-03 ENCOUNTER — Ambulatory Visit: Payer: Medicare Other | Admitting: Family Medicine

## 2020-06-05 ENCOUNTER — Ambulatory Visit (INDEPENDENT_AMBULATORY_CARE_PROVIDER_SITE_OTHER): Payer: Medicare Other | Admitting: Family Medicine

## 2020-06-05 ENCOUNTER — Other Ambulatory Visit: Payer: Self-pay

## 2020-06-05 ENCOUNTER — Encounter: Payer: Self-pay | Admitting: Family Medicine

## 2020-06-05 VITALS — BP 160/80 | HR 80 | Temp 97.7°F | Resp 18 | Ht 70.0 in | Wt 205.6 lb

## 2020-06-05 DIAGNOSIS — I10 Essential (primary) hypertension: Secondary | ICD-10-CM

## 2020-06-05 DIAGNOSIS — E785 Hyperlipidemia, unspecified: Secondary | ICD-10-CM

## 2020-06-05 LAB — COMPREHENSIVE METABOLIC PANEL
ALT: 24 U/L (ref 0–53)
AST: 25 U/L (ref 0–37)
Albumin: 4.3 g/dL (ref 3.5–5.2)
Alkaline Phosphatase: 34 U/L — ABNORMAL LOW (ref 39–117)
BUN: 14 mg/dL (ref 6–23)
CO2: 28 mEq/L (ref 19–32)
Calcium: 10 mg/dL (ref 8.4–10.5)
Chloride: 102 mEq/L (ref 96–112)
Creatinine, Ser: 1.2 mg/dL (ref 0.40–1.50)
GFR: 59.83 mL/min — ABNORMAL LOW (ref >60.00)
Glucose, Bld: 115 mg/dL — ABNORMAL HIGH (ref 70–99)
Potassium: 3.9 mEq/L (ref 3.5–5.1)
Sodium: 139 mEq/L (ref 135–145)
Total Bilirubin: 0.6 mg/dL (ref 0.2–1.2)
Total Protein: 6.8 g/dL (ref 6.0–8.3)

## 2020-06-05 LAB — LIPID PANEL
Cholesterol: 129 mg/dL (ref 0–200)
HDL: 44.7 mg/dL (ref >39.00)
LDL Cholesterol: 64 mg/dL (ref 0–99)
NonHDL: 84.39
Total CHOL/HDL Ratio: 3
Triglycerides: 100 mg/dL (ref 0.0–149.0)
VLDL: 20 mg/dL (ref 0.0–40.0)

## 2020-06-05 MED ORDER — LOSARTAN POTASSIUM 50 MG PO TABS: 50.0000 mg | ORAL_TABLET | Freq: Every day | ORAL | 3 refills | Status: DC

## 2020-06-05 MED ORDER — AMLODIPINE BESYLATE 5 MG PO TABS: ORAL_TABLET | ORAL | 3 refills | Status: DC

## 2020-06-05 NOTE — Progress Notes (Signed)
Patient ID: David Nielsen, male    DOB: 19-Sep-1951  Age: 69 y.o. MRN: [id number]    Subjective:  Subjective  HPI David Nielsen presents for f/u bp and cholesterol.  No complaint   Review of Systems  Constitutional: Negative for appetite change, diaphoresis, fatigue and unexpected weight change.  Eyes: Negative for pain, redness and visual disturbance.  Respiratory: Negative for cough, chest tightness, shortness of breath and wheezing.   Cardiovascular: Negative for chest pain, palpitations and leg swelling.  Endocrine: Negative for cold intolerance, heat intolerance, polydipsia, polyphagia and polyuria.  Genitourinary: Negative for difficulty urinating, dysuria and frequency.  Neurological: Negative for dizziness, light-headedness, numbness and headaches.    History Past Medical History:  Diagnosis Date  . Chicken pox   . GERD (gastroesophageal reflux disease)   . H/O gastroesophageal reflux (GERD)   . HTN (hypertension)   . Hyperlipidemia   . PVC's (premature ventricular contractions)     He has a past surgical history that includes none and Colonoscopy.   His family history includes Alcohol abuse in his son; Alcohol abuse (age of onset: 8) in his father; Cancer in his sister; Colon polyps in his brother; Stroke in his mother.He reports that he has never smoked. He has never used smokeless tobacco. He reports current alcohol use. He reports that he does not use drugs.  Current Outpatient Medications on File Prior to Visit  Medication Sig Dispense Refill  . aspirin EC 81 MG tablet Take 81 mg by mouth daily.    Marland Kitchen atorvastatin (LIPITOR) 10 MG tablet TAKE 1 TABLET DAILY 90 tablet 3  . Docusate Sodium (COLACE PO) Take by mouth daily.    . fenofibrate 160 MG tablet Take 1 tablet (160 mg total) by mouth daily. Pt needs OV for further refills 30 tablet 0  . ibuprofen (ADVIL) 200 MG tablet     . Omega-3 Fatty Acids (FISH OIL) 1000 MG CAPS Take by mouth daily.    Marland Kitchen omeprazole  (PRILOSEC) 20 MG capsule Take 20 mg by mouth daily.     Current Facility-Administered Medications on File Prior to Visit  Medication Dose Route Frequency Provider Last Rate Last Admin  . 0.9 %  sodium chloride infusion  500 mL Intravenous Continuous Ladene Artist, MD         Objective:  Objective  Physical Exam Vitals and nursing note reviewed.  Constitutional:      General: He is sleeping.     Appearance: He is well-developed.  HENT:     Head: Normocephalic and atraumatic.  Eyes:     Pupils: Pupils are equal, round, and reactive to light.  Neck:     Thyroid: No thyromegaly.  Cardiovascular:     Rate and Rhythm: Normal rate and regular rhythm.     Heart sounds: No murmur heard.   Pulmonary:     Effort: Pulmonary effort is normal. No respiratory distress.     Breath sounds: Normal breath sounds. No wheezing or rales.  Chest:     Chest wall: No tenderness.  Musculoskeletal:        General: No tenderness.     Cervical back: Normal range of motion and neck supple.  Skin:    General: Skin is warm and dry.  Neurological:     Mental Status: He is oriented to person, place, and time.  Psychiatric:        Behavior: Behavior normal.        Thought Content: Thought content normal.  Judgment: Judgment normal.    BP (!) 160/80 (BP Location: Right Arm, Patient Position: Sitting, Cuff Size: Normal)   Pulse 80   Temp 97.7 F (36.5 C) (Oral)   Resp 18   Ht 5\' 10"  (1.778 m)   Wt 205 lb 9.6 oz (93.3 kg)   SpO2 97%   BMI 29.50 kg/m  Wt Readings from Last 3 Encounters:  06/05/20 205 lb 9.6 oz (93.3 kg)  08/08/18 209 lb 9.6 oz (95.1 kg)  02/10/18 210 lb (95.3 kg)     Lab Results  Component Value Date   WBC 5.2 08/08/2018   HGB 13.9 08/08/2018   HCT 40.3 08/08/2018   PLT 284.0 08/08/2018   GLUCOSE 82 04/05/2019   CHOL 126 04/05/2019   TRIG 90.0 04/05/2019   HDL 45.70 04/05/2019   LDLDIRECT 81.0 08/20/2016   LDLCALC 63 04/05/2019   ALT 26 04/05/2019   AST 24  04/05/2019   NA 140 04/05/2019   K 4.1 04/05/2019   CL 101 04/05/2019   CREATININE 1.04 04/05/2019   BUN 17 04/05/2019   CO2 32 04/05/2019   PSA 1.24 02/10/2018   HGBA1C 6.3 12/07/2016    Patient was never admitted.   Assessment & Plan:  Plan  I have changed David Nielsen's amLODipine. I am also having him start on losartan. Additionally, I am having him maintain his omeprazole, Fish Oil, aspirin EC, Docusate Sodium (COLACE PO), ibuprofen, atorvastatin, and fenofibrate. We will continue to administer sodium chloride.  Meds ordered this encounter  Medications  . amLODipine (NORVASC) 5 MG tablet    Sig: TAKE 1 TABLET DAILY    Dispense:  90 tablet    Refill:  3  . losartan (COZAAR) 50 MG tablet    Sig: Take 1 tablet (50 mg total) by mouth daily.    Dispense:  90 tablet    Refill:  3    Problem List Items Addressed This Visit      Unprioritized   HTN (hypertension) - Primary    Poorly controlled will alter medications, encouraged DASH diet, minimize caffeine and obtain adequate sleep. Report concerning symptoms and follow up as directed and as needed con't norvasc Add losartan 50 mg  F/u 3 months or sooner prn       Relevant Medications   amLODipine (NORVASC) 5 MG tablet   losartan (COZAAR) 50 MG tablet   Other Relevant Orders   Lipid panel   Comprehensive metabolic panel   Hyperlipidemia LDL goal <100    Tolerating statin, encouraged heart healthy diet, avoid trans fats, minimize simple carbs and saturated fats. Increase exercise as tolerated      Relevant Medications   amLODipine (NORVASC) 5 MG tablet   losartan (COZAAR) 50 MG tablet    Other Visit Diagnoses    Dyslipidemia       Relevant Orders   Lipid panel   Comprehensive metabolic panel      Follow-up: Return if symptoms worsen or fail to improve, for annual exam, fasting.  Ann Held, DO

## 2020-06-05 NOTE — Assessment & Plan Note (Signed)
Tolerating statin, encouraged heart healthy diet, avoid trans fats, minimize simple carbs and saturated fats. Increase exercise as tolerated 

## 2020-06-05 NOTE — Assessment & Plan Note (Addendum)
Poorly controlled will alter medications, encouraged DASH diet, minimize caffeine and obtain adequate sleep. Report concerning symptoms and follow up as directed and as needed con't norvasc Add losartan 50 mg  F/u 3 months or sooner prn

## 2020-06-05 NOTE — Patient Instructions (Signed)
DASH Eating Plan DASH stands for "Dietary Approaches to Stop Hypertension." The DASH eating plan is a healthy eating plan that has been shown to reduce high blood pressure (hypertension). It may also reduce your risk for type 2 diabetes, heart disease, and stroke. The DASH eating plan may also help with weight loss. What are tips for following this plan?  General guidelines  Avoid eating more than 2,300 mg (milligrams) of salt (sodium) a day. If you have hypertension, you may need to reduce your sodium intake to 1,500 mg a day.  Limit alcohol intake to no more than 1 drink a day for nonpregnant women and 2 drinks a day for men. One drink equals 12 oz of beer, 5 oz of wine, or 1 oz of hard liquor.  Work with your health care provider to maintain a healthy body weight or to lose weight. Ask what an ideal weight is for you.  Get at least 30 minutes of exercise that causes your heart to beat faster (aerobic exercise) most days of the week. Activities may include walking, swimming, or biking.  Work with your health care provider or diet and nutrition specialist (dietitian) to adjust your eating plan to your individual calorie needs. Reading food labels   Check food labels for the amount of sodium per serving. Choose foods with less than 5 percent of the Daily Value of sodium. Generally, foods with less than 300 mg of sodium per serving fit into this eating plan.  To find whole grains, look for the word "whole" as the first word in the ingredient list. Shopping  Buy products labeled as "low-sodium" or "no salt added."  Buy fresh foods. Avoid canned foods and premade or frozen meals. Cooking  Avoid adding salt when cooking. Use salt-free seasonings or herbs instead of table salt or sea salt. Check with your health care provider or pharmacist before using salt substitutes.  Do not fry foods. Cook foods using healthy methods such as baking, boiling, grilling, and broiling instead.  Cook with  heart-healthy oils, such as olive, canola, soybean, or sunflower oil. Meal planning  Eat a balanced diet that includes: ? 5 or more servings of fruits and vegetables each day. At each meal, try to fill half of your plate with fruits and vegetables. ? Up to 6-8 servings of whole grains each day. ? Less than 6 oz of lean meat, poultry, or fish each day. A 3-oz serving of meat is about the same size as a deck of cards. One egg equals 1 oz. ? 2 servings of low-fat dairy each day. ? A serving of nuts, seeds, or beans 5 times each week. ? Heart-healthy fats. Healthy fats called Omega-3 fatty acids are found in foods such as flaxseeds and coldwater fish, like sardines, salmon, and mackerel.  Limit how much you eat of the following: ? Canned or prepackaged foods. ? Food that is high in trans fat, such as fried foods. ? Food that is high in saturated fat, such as fatty meat. ? Sweets, desserts, sugary drinks, and other foods with added sugar. ? Full-fat dairy products.  Do not salt foods before eating.  Try to eat at least 2 vegetarian meals each week.  Eat more home-cooked food and less restaurant, buffet, and fast food.  When eating at a restaurant, ask that your food be prepared with less salt or no salt, if possible. What foods are recommended? The items listed may not be a complete list. Talk with your dietitian about   what dietary choices are best for you. Grains Whole-grain or whole-wheat bread. Whole-grain or whole-wheat pasta. Brown rice. Oatmeal. Quinoa. Bulgur. Whole-grain and low-sodium cereals. Pita bread. Low-fat, low-sodium crackers. Whole-wheat flour tortillas. Vegetables Fresh or frozen vegetables (raw, steamed, roasted, or grilled). Low-sodium or reduced-sodium tomato and vegetable juice. Low-sodium or reduced-sodium tomato sauce and tomato paste. Low-sodium or reduced-sodium canned vegetables. Fruits All fresh, dried, or frozen fruit. Canned fruit in natural juice (without  added sugar). Meat and other protein foods Skinless chicken or turkey. Ground chicken or turkey. Pork with fat trimmed off. Fish and seafood. Egg whites. Dried beans, peas, or lentils. Unsalted nuts, nut butters, and seeds. Unsalted canned beans. Lean cuts of beef with fat trimmed off. Low-sodium, lean deli meat. Dairy Low-fat (1%) or fat-free (skim) milk. Fat-free, low-fat, or reduced-fat cheeses. Nonfat, low-sodium ricotta or cottage cheese. Low-fat or nonfat yogurt. Low-fat, low-sodium cheese. Fats and oils Soft margarine without trans fats. Vegetable oil. Low-fat, reduced-fat, or light mayonnaise and salad dressings (reduced-sodium). Canola, safflower, olive, soybean, and sunflower oils. Avocado. Seasoning and other foods Herbs. Spices. Seasoning mixes without salt. Unsalted popcorn and pretzels. Fat-free sweets. What foods are not recommended? The items listed may not be a complete list. Talk with your dietitian about what dietary choices are best for you. Grains Baked goods made with fat, such as croissants, muffins, or some breads. Dry pasta or rice meal packs. Vegetables Creamed or fried vegetables. Vegetables in a cheese sauce. Regular canned vegetables (not low-sodium or reduced-sodium). Regular canned tomato sauce and paste (not low-sodium or reduced-sodium). Regular tomato and vegetable juice (not low-sodium or reduced-sodium). Pickles. Olives. Fruits Canned fruit in a light or heavy syrup. Fried fruit. Fruit in cream or butter sauce. Meat and other protein foods Fatty cuts of meat. Ribs. Fried meat. Bacon. Sausage. Bologna and other processed lunch meats. Salami. Fatback. Hotdogs. Bratwurst. Salted nuts and seeds. Canned beans with added salt. Canned or smoked fish. Whole eggs or egg yolks. Chicken or turkey with skin. Dairy Whole or 2% milk, cream, and half-and-half. Whole or full-fat cream cheese. Whole-fat or sweetened yogurt. Full-fat cheese. Nondairy creamers. Whipped toppings.  Processed cheese and cheese spreads. Fats and oils Butter. Stick margarine. Lard. Shortening. Ghee. Bacon fat. Tropical oils, such as coconut, palm kernel, or palm oil. Seasoning and other foods Salted popcorn and pretzels. Onion salt, garlic salt, seasoned salt, table salt, and sea salt. Worcestershire sauce. Tartar sauce. Barbecue sauce. Teriyaki sauce. Soy sauce, including reduced-sodium. Steak sauce. Canned and packaged gravies. Fish sauce. Oyster sauce. Cocktail sauce. Horseradish that you find on the shelf. Ketchup. Mustard. Meat flavorings and tenderizers. Bouillon cubes. Hot sauce and Tabasco sauce. Premade or packaged marinades. Premade or packaged taco seasonings. Relishes. Regular salad dressings. Where to find more information:  National Heart, Lung, and Blood Institute: www.nhlbi.nih.gov  American Heart Association: www.heart.org Summary  The DASH eating plan is a healthy eating plan that has been shown to reduce high blood pressure (hypertension). It may also reduce your risk for type 2 diabetes, heart disease, and stroke.  With the DASH eating plan, you should limit salt (sodium) intake to 2,300 mg a day. If you have hypertension, you may need to reduce your sodium intake to 1,500 mg a day.  When on the DASH eating plan, aim to eat more fresh fruits and vegetables, whole grains, lean proteins, low-fat dairy, and heart-healthy fats.  Work with your health care provider or diet and nutrition specialist (dietitian) to adjust your eating plan to your   individual calorie needs. This information is not intended to replace advice given to you by your health care provider. Make sure you discuss any questions you have with your health care provider. Document Revised: 09/16/2017 Document Reviewed: 09/27/2016 Elsevier Patient Education  2020 Elsevier Inc.  

## 2020-06-16 NOTE — Progress Notes (Signed)
I connected with David Nielsen today by telephone and verified that I am speaking with the correct person using two identifiers. Location patient: home Location provider: work Persons participating in the virtual visit: patient, Marine scientist.    I discussed the limitations, risks, security and privacy concerns of performing an evaluation and management service by telephone and the availability of in person appointments. I also discussed with the patient that there may be a patient responsible charge related to this service. The patient expressed understanding and verbally consented to this telephonic visit.    Interactive audio and video telecommunications were attempted between this provider and patient, however failed, due to patient having technical difficulties OR patient did not have access to video capability.  We continued and completed visit with audio only.  Some vital signs may be absent or patient reported.   Subjective:   David Nielsen is a 69 y.o. male who presents for an Initial Medicare Annual Wellness Visit.  Pt still works part time at [employer].  Review of Systems     Cardiac Risk Factors include: advanced age (>60men, >80 women);dyslipidemia;hypertension;male gender     Objective:    Today's Vitals   06/17/20 1340  BP: 125/72   There is no height or weight on file to calculate BMI.  Advanced Directives 06/17/2020 12/02/2016 02/17/2016  Does Patient Have a Medical Advance Directive? No No No  Would patient like information on creating a medical advance directive? No - Patient declined - Yes - Educational materials given    Current Medications (verified) Outpatient Encounter Medications as of 06/17/2020  Medication Sig  . amLODipine (NORVASC) 5 MG tablet TAKE 1 TABLET DAILY  . aspirin EC 81 MG tablet Take 81 mg by mouth daily.  Marland Kitchen atorvastatin (LIPITOR) 10 MG tablet TAKE 1 TABLET DAILY  . Docusate Sodium (COLACE PO) Take by mouth daily.  . fenofibrate 160 MG tablet Take 1  tablet (160 mg total) by mouth daily. Pt needs OV for further refills  . ibuprofen (ADVIL) 200 MG tablet   . losartan (COZAAR) 50 MG tablet Take 1 tablet (50 mg total) by mouth daily.  Marland Kitchen omeprazole (PRILOSEC) 20 MG capsule Take 20 mg by mouth daily.  . Omega-3 Fatty Acids (FISH OIL) 1000 MG CAPS Take by mouth daily. (Patient not taking: Reported on 06/17/2020)   Facility-Administered Encounter Medications as of 06/17/2020  Medication  . 0.9 %  sodium chloride infusion    Allergies (verified) Patient has no known allergies.   History: Past Medical History:  Diagnosis Date  . Chicken pox   . GERD (gastroesophageal reflux disease)   . H/O gastroesophageal reflux (GERD)   . HTN (hypertension)   . Hyperlipidemia   . PVC's (premature ventricular contractions)    Past Surgical History:  Procedure Laterality Date  . COLONOSCOPY    . none     Family History  Problem Relation Age of Onset  . Alcohol abuse Father 35       pneumonia  . Stroke Mother        Smoker  . Cancer Sister        breast  . Colon polyps Brother   . Alcohol abuse Son   . Stomach cancer Neg Hx   . Rectal cancer Neg Hx   . Esophageal cancer Neg Hx   . Liver cancer Neg Hx   . Colon cancer Neg Hx    Social History   Socioeconomic History  . Marital status: Married    Spouse name: Not  on file  . Number of children: 2  . Years of education: Not on file  . Highest education level: Not on file  Occupational History  . Occupation: semi-retired    Comment: retired Youth worker, Insurance underwriter: [employer]  Tobacco Use  . Smoking status: Never Smoker  . Smokeless tobacco: Never Used  Substance and Sexual Activity  . Alcohol use: Yes    Alcohol/week: 0.0 standard drinks    Comment: rare -- wine -- weekend  . Drug use: No  . Sexual activity: Yes    Partners: Female, Male  Other Topics Concern  . Not on file  Social History Narrative   Exercise-- 3x a week on total gym and  treadmill   Social Determinants of Health   Financial Resource Strain: Low Risk   . Difficulty of Paying Living Expenses: Not hard at all  Food Insecurity: No Food Insecurity  . Worried About Charity fundraiser in the Last Year: Never true  . Ran Out of Food in the Last Year: Never true  Transportation Needs: No Transportation Needs  . Lack of Transportation (Medical): No  . Lack of Transportation (Non-Medical): No  Physical Activity:   . Days of Exercise per Week: Not on file  . Minutes of Exercise per Session: Not on file  Stress:   . Feeling of Stress : Not on file  Social Connections:   . Frequency of Communication with Friends and Family: Not on file  . Frequency of Social Gatherings with Friends and Family: Not on file  . Attends Religious Services: Not on file  . Active Member of Clubs or Organizations: Not on file  . Attends Archivist Meetings: Not on file  . Marital Status: Not on file    Tobacco Counseling Counseling given: Not Answered   Clinical Intake: Pain : No/denies pain    Activities of Daily Living In your present state of health, do you have any difficulty performing the following activities: 06/17/2020 06/05/2020  Hearing? N N  Vision? N N  Difficulty concentrating or making decisions? N N  Walking or climbing stairs? N N  Dressing or bathing? N N  Doing errands, shopping? N N  Preparing Food and eating ? N -  Using the Toilet? N -  In the past six months, have you accidently leaked urine? N -  Do you have problems with loss of bowel control? N -  Managing your Medications? N -  Managing your Finances? N -  Housekeeping or managing your Housekeeping? N -  Some recent data might be hidden    Patient Care Team: Carollee Herter, Alferd Apa, DO as PCP - General (Family Medicine) Lavonna Monarch, MD as Consulting Physician (Dermatology)  Indicate any recent Medical Services you may have received from other than [facility] providers in the past year  (date may be approximate).     Assessment:   This is a routine wellness examination for David Nielsen.   Dietary issues and exercise activities discussed: Current Exercise Habits: The patient has a physically strenuous job, but has no regular exercise apart from work., Exercise limited by: None identified Diet (meal preparation, eat out, water intake, caffeinated beverages, dairy products, fruits and vegetables): in general, a "healthy" diet  , well balanced    Goals    . maintain healthy active lifestyle      Depression Screen PHQ 2/9 Scores 06/17/2020 06/05/2020 02/10/2018 08/20/2016 02/17/2016 02/17/2016 11/06/2015  PHQ - 2  Score 0 0 0 0 0 0 0    Fall Risk Fall Risk  06/17/2020 06/05/2020 05/16/2019 02/10/2018 02/17/2016  Falls in the past year? 0 0 (No Data) No Yes  Comment - - Emmi Telephone Survey: data to providers prior to load - -  Number falls in past yr: 0 0 (No Data) - 1  Comment - - Emmi Telephone Survey Actual Response =  - -  Injury with Fall? 0 0 - - No  Risk for fall due to : - - - - Other (Comment)  Risk for fall due to: Comment - - - - ladder leaned and fell over  Follow up Education provided;Falls prevention discussed - - - Education provided    Any stairs in or around the home? No  If so, are there any without handrails? No  Home free of loose throw rugs in walkways, pet beds, electrical cords, etc? Yes  Adequate lighting in your home to reduce risk of falls? Yes   ASSISTIVE DEVICES UTILIZED TO PREVENT FALLS:  Life alert? No  Use of a cane, walker or w/c? No  Grab bars in the bathroom? No  Shower chair or bench in shower? No  Elevated toilet seat or a handicapped toilet? No    Cognitive Function: Ad8 score reviewed for issues:  Issues making decisions:no  Less interest in hobbies / activities:no  Repeats questions, stories (family complaining):no  Trouble using ordinary gadgets (microwave, computer, phone):no  Forgets the month or year: no  Mismanaging  finances: no  Remembering appts:no  Daily problems with thinking and/or memory:no Ad8 score is=0        Immunizations Immunization History  Administered Date(s) Administered  . Fluad Quad(high Dose 65+) 06/28/2019  . Moderna SARS-COVID-2 Vaccination 11/29/2019, 12/24/2019  . Tdap 02/10/2018    TDAP status: Up to date Flu Vaccine status: Up to date Covid-19 vaccine status: Completed vaccines   Screening Tests Health Maintenance  Topic Date Due  . INFLUENZA VACCINE  05/18/2020  . PNA vac Low Risk Adult (1 of 2 - PCV13) 08/08/2020 (Originally 04/12/2015)  . COLONOSCOPY  12/17/2026  . TETANUS/TDAP  02/11/2028  . COVID-19 Vaccine  Completed  . Hepatitis C Screening  Completed    Health Maintenance  Health Maintenance Due  Topic Date Due  . INFLUENZA VACCINE  05/18/2020    Colorectal cancer screening: Completed 12/16/16. Repeat every 10 years   Additional Screening:  Hepatitis C Screening: does qualify; Completed 11/07/15   Vision Screening: Recommended annual ophthalmology exams for early detection of glaucoma and other disorders of the eye.   Dental Screening: Recommended annual dental exams for proper oral hygiene  Community Resource Referral / Chronic Care Management: CRR required this visit?  No   CCM required this visit?  No      Plan:    Continue to eat heart healthy diet (full of fruits, vegetables, whole grains, lean protein, water--limit salt, fat, and sugar intake) and increase physical activity as tolerated.  Continue doing brain stimulating activities (puzzles, reading, adult coloring books, staying active) to keep memory sharp.    I have personally reviewed and noted the following in the patient's chart:   . Medical and social history . Use of alcohol, tobacco or illicit drugs  . Current medications and supplements . Functional ability and status . Nutritional status . Physical activity . Advanced directives . List of other  physicians . Hospitalizations, surgeries, and ER visits in previous 12 months . Vitals . Screenings to include  cognitive, depression, and falls . Referrals and appointments  In addition, I have reviewed and discussed with patient certain preventive protocols, quality metrics, and best practice recommendations. A written personalized care plan for preventive services as well as general preventive health recommendations were provided to patient.   Due to this being a telephonic visit, the after visit summary with patients personalized plan was offered to patient via mail or my-chart.  Patient would like to access on my-chart.   Shela Nevin, [place]   06/17/2020

## 2020-06-17 ENCOUNTER — Ambulatory Visit (INDEPENDENT_AMBULATORY_CARE_PROVIDER_SITE_OTHER): Payer: Medicare Other | Admitting: *Deleted

## 2020-06-17 ENCOUNTER — Other Ambulatory Visit: Payer: Self-pay

## 2020-06-17 ENCOUNTER — Encounter: Payer: Self-pay | Admitting: *Deleted

## 2020-06-17 VITALS — BP 125/72

## 2020-06-17 DIAGNOSIS — Z0000 Encounter for general adult medical examination without abnormal findings: Secondary | ICD-10-CM

## 2020-06-17 NOTE — Patient Instructions (Signed)
Continue to eat heart healthy diet (full of fruits, vegetables, whole grains, lean protein, water--limit salt, fat, and sugar intake) and increase physical activity as tolerated.  Continue doing brain stimulating activities (puzzles, reading, adult coloring books, staying active) to keep memory sharp.    David Nielsen , Thank you for taking time to come for your Medicare Wellness Visit. I appreciate your ongoing commitment to your health goals. Please review the following plan we discussed and let me know if I can assist you in the future.   These are the goals we discussed: Goals    . maintain healthy active lifestyle       This is a list of the screening recommended for you and due dates:  Health Maintenance  Topic Date Due  . Flu Shot  05/18/2020  . Pneumonia vaccines (1 of 2 - PCV13) 08/08/2020*  . Colon Cancer Screening  12/17/2026  . Tetanus Vaccine  02/11/2028  . COVID-19 Vaccine  Completed  .  Hepatitis C: One time screening is recommended by Center for Disease Control  (CDC) for  adults born from [birth year] through 1966.   Completed  *Topic was postponed. The date shown is not the original due date.    Preventive Care 8 Years and Older, Male Preventive care refers to lifestyle choices and visits with your health care provider that can promote health and wellness. This includes:  A yearly physical exam. This is also called an annual well check.  Regular dental and eye exams.  Immunizations.  Screening for certain conditions.  Healthy lifestyle choices, such as diet and exercise. What can I expect for my preventive care visit? Physical exam Your health care provider will check:  Height and weight. These may be used to calculate body mass index (BMI), which is a measurement that tells if you are at a healthy weight.  Heart rate and blood pressure.  Your skin for abnormal spots. Counseling Your health care provider may ask you questions about:  Alcohol, tobacco, and  drug use.  Emotional well-being.  Home and relationship well-being.  Sexual activity.  Eating habits.  History of falls.  Memory and ability to understand (cognition).  Work and work Statistician. What immunizations do I need?  Influenza (flu) vaccine  This is recommended every year. Tetanus, diphtheria, and pertussis (Tdap) vaccine  You may need a Td booster every 10 years. Varicella (chickenpox) vaccine  You may need this vaccine if you have not already been vaccinated. Zoster (shingles) vaccine  You may need this after age 62. Pneumococcal conjugate (PCV13) vaccine  One dose is recommended after age 53. Pneumococcal polysaccharide (PPSV23) vaccine  One dose is recommended after age 24. Measles, mumps, and rubella (MMR) vaccine  You may need at least one dose of MMR if you were born in 1957 or later. You may also need a second dose. Meningococcal conjugate (MenACWY) vaccine  You may need this if you have certain conditions. Hepatitis A vaccine  You may need this if you have certain conditions or if you travel or work in places where you may be exposed to hepatitis A. Hepatitis B vaccine  You may need this if you have certain conditions or if you travel or work in places where you may be exposed to hepatitis B. Haemophilus influenzae type b (Hib) vaccine  You may need this if you have certain conditions. You may receive vaccines as individual doses or as more than one vaccine together in one shot (combination vaccines). Talk with your  health care provider about the risks and benefits of combination vaccines. What tests do I need? Blood tests  Lipid and cholesterol levels. These may be checked every 5 years, or more frequently depending on your overall health.  Hepatitis C test.  Hepatitis B test. Screening  Lung cancer screening. You may have this screening every year starting at age 55 if you have a 30-pack-year history of smoking and currently smoke or  have quit within the past 15 years.  Colorectal cancer screening. All adults should have this screening starting at age 47 and continuing until age 35. Your health care provider may recommend screening at age 11 if you are at increased risk. You will have tests every 1-10 years, depending on your results and the type of screening test.  Prostate cancer screening. Recommendations will vary depending on your family history and other risks.  Diabetes screening. This is done by checking your blood sugar (glucose) after you have not eaten for a while (fasting). You may have this done every 1-3 years.  Abdominal aortic aneurysm (AAA) screening. You may need this if you are a current or former smoker.  Sexually transmitted disease (STD) testing. Follow these instructions at home: Eating and drinking  Eat a diet that includes fresh fruits and vegetables, whole grains, lean protein, and low-fat dairy products. Limit your intake of foods with high amounts of sugar, saturated fats, and salt.  Take vitamin and mineral supplements as recommended by your health care provider.  Do not drink alcohol if your health care provider tells you not to drink.  If you drink alcohol: ? Limit how much you have to 0-2 drinks a day. ? Be aware of how much alcohol is in your drink. In the [place] one drink equals one 12 oz bottle of beer (355 mL), one 5 oz glass of wine (148 mL), or one 1 oz glass of hard liquor (44 mL). Lifestyle  Take daily care of your teeth and gums.  Stay active. Exercise for at least 30 minutes on 5 or more days each week.  Do not use any products that contain nicotine or tobacco, such as cigarettes, e-cigarettes, and chewing tobacco. If you need help quitting, ask your health care provider.  If you are sexually active, practice safe sex. Use a condom or other form of protection to prevent STIs (sexually transmitted infections).  Talk with your health care provider about taking a low-dose  aspirin or statin. What's next?  Visit your health care provider once a year for a well check visit.  Ask your health care provider how often you should have your eyes and teeth checked.  Stay up to date on all vaccines. This information is not intended to replace advice given to you by your health care provider. Make sure you discuss any questions you have with your health care provider. Document Revised: 09/28/2018 Document Reviewed: 09/28/2018 Elsevier Patient Education  2020 [employer].

## 2020-06-25 ENCOUNTER — Encounter: Payer: Self-pay | Admitting: Family Medicine

## 2020-07-24 ENCOUNTER — Ambulatory Visit (INDEPENDENT_AMBULATORY_CARE_PROVIDER_SITE_OTHER): Payer: Medicare Other

## 2020-07-24 ENCOUNTER — Other Ambulatory Visit: Payer: Self-pay

## 2020-07-24 DIAGNOSIS — Z23 Encounter for immunization: Secondary | ICD-10-CM

## 2020-07-25 ENCOUNTER — Other Ambulatory Visit: Payer: Self-pay | Admitting: Family Medicine

## 2020-08-04 ENCOUNTER — Encounter: Payer: Self-pay | Admitting: Family Medicine

## 2020-08-04 MED ORDER — FENOFIBRATE 160 MG PO TABS: 160.0000 mg | ORAL_TABLET | Freq: Every day | ORAL | 2 refills | Status: DC

## 2020-09-05 ENCOUNTER — Other Ambulatory Visit: Payer: Self-pay | Admitting: Family Medicine

## 2020-09-15 ENCOUNTER — Other Ambulatory Visit (HOSPITAL_BASED_OUTPATIENT_CLINIC_OR_DEPARTMENT_OTHER): Payer: Self-pay | Admitting: Internal Medicine

## 2020-09-15 ENCOUNTER — Ambulatory Visit: Payer: Medicare Other | Attending: Internal Medicine

## 2020-09-15 DIAGNOSIS — Z23 Encounter for immunization: Secondary | ICD-10-CM

## 2020-09-15 MED FILL — MODERNA COVID-19 VACCINE 10: 100 | 1 days supply | Qty: 0 | Fill #0

## 2020-09-15 NOTE — Progress Notes (Signed)
   Covid-19 Vaccination Clinic  Name:  David Nielsen    MRN: [id number] DOB: October 24, 1950  09/15/2020  [name] was observed post Covid-19 immunization for 15 minutes without incident. He was provided with Vaccine Information Sheet and instruction to access the V-Safe system.   [name] was instructed to call 911 with any severe reactions post vaccine: Marland Kitchen Difficulty breathing  . Swelling of face and throat  . A fast heartbeat  . A bad rash all over body  . Dizziness and weakness   Immunizations Administered    No immunizations on file.

## 2020-10-24 ENCOUNTER — Other Ambulatory Visit: Payer: Self-pay | Admitting: Family Medicine

## 2020-11-30 ENCOUNTER — Other Ambulatory Visit: Payer: Self-pay | Admitting: Family Medicine

## 2020-12-19 ENCOUNTER — Other Ambulatory Visit: Payer: Self-pay | Admitting: Family Medicine

## 2021-01-07 ENCOUNTER — Encounter: Payer: Self-pay | Admitting: Family Medicine

## 2021-01-07 MED ORDER — ATORVASTATIN CALCIUM 10 MG PO TABS: ORAL_TABLET | ORAL | 0 refills | Status: DC

## 2021-01-19 ENCOUNTER — Other Ambulatory Visit: Payer: Self-pay

## 2021-01-20 ENCOUNTER — Ambulatory Visit (INDEPENDENT_AMBULATORY_CARE_PROVIDER_SITE_OTHER): Payer: Medicare Other | Admitting: Family Medicine

## 2021-01-20 ENCOUNTER — Encounter: Payer: Self-pay | Admitting: Family Medicine

## 2021-01-20 ENCOUNTER — Other Ambulatory Visit: Payer: Self-pay

## 2021-01-20 ENCOUNTER — Ambulatory Visit: Payer: Medicare Other | Attending: Internal Medicine

## 2021-01-20 VITALS — BP 122/70 | HR 77 | Temp 98.0°F | Resp 18 | Ht 70.0 in | Wt 211.4 lb

## 2021-01-20 DIAGNOSIS — R269 Unspecified abnormalities of gait and mobility: Secondary | ICD-10-CM | POA: Diagnosis not present

## 2021-01-20 DIAGNOSIS — I10 Essential (primary) hypertension: Secondary | ICD-10-CM

## 2021-01-20 DIAGNOSIS — R739 Hyperglycemia, unspecified: Secondary | ICD-10-CM

## 2021-01-20 DIAGNOSIS — G629 Polyneuropathy, unspecified: Secondary | ICD-10-CM | POA: Diagnosis not present

## 2021-01-20 DIAGNOSIS — E785 Hyperlipidemia, unspecified: Secondary | ICD-10-CM

## 2021-01-20 DIAGNOSIS — Z23 Encounter for immunization: Secondary | ICD-10-CM

## 2021-01-20 MED ORDER — LOSARTAN POTASSIUM 50 MG PO TABS: 50.0000 mg | ORAL_TABLET | Freq: Every day | ORAL | 3 refills | Status: DC

## 2021-01-20 NOTE — Progress Notes (Signed)
   Covid-19 Vaccination Clinic  Name:  David Nielsen    MRN: [id number] DOB: Jan 03, 1951  01/20/2021  Mr. Sky was observed post Covid-19 immunization for 15 minutes without incident. He was provided with Vaccine Information Sheet and instruction to access the V-Safe system.   Mr. Vincent was instructed to call 911 with any severe reactions post vaccine: Marland Kitchen Difficulty breathing  . Swelling of face and throat  . A fast heartbeat  . A bad rash all over body  . Dizziness and weakness   Immunizations Administered    Name Date Dose VIS Date Route   Moderna Covid-19 Booster Vaccine 01/20/2021  2:05 PM 0.25 mL 08/06/2020 Intramuscular   Manufacturer: Levan Hurst   Lot: [id number]   [place]: [id number]

## 2021-01-20 NOTE — Progress Notes (Signed)
Subjective:    Patient ID: David Nielsen, male    DOB: 12-14-1950, 70 y.o.   MRN: [id number]  Chief Complaint  Patient presents with  . Hypertension  . Hyperlipidemia  . Follow-up    HPI Patient is in today for f/u bp and cholesterol and hyperglycemia.   He has a hx neuropathy but has not taken meds or seen a specialist for it nor does he need to.    Past Medical History:  Diagnosis Date  . Chicken pox   . GERD (gastroesophageal reflux disease)   . H/O gastroesophageal reflux (GERD)   . HTN (hypertension)   . Hyperlipidemia   . PVC's (premature ventricular contractions)     Past Surgical History:  Procedure Laterality Date  . COLONOSCOPY    . none      Family History  Problem Relation Age of Onset  . Alcohol abuse Father 69       pneumonia  . Stroke Mother        Smoker  . Cancer Sister        breast  . Colon polyps Brother   . Alcohol abuse Son   . Stomach cancer Neg Hx   . Rectal cancer Neg Hx   . Esophageal cancer Neg Hx   . Liver cancer Neg Hx   . Colon cancer Neg Hx     Social History   Socioeconomic History  . Marital status: Married    Spouse name: Not on file  . Number of children: 2  . Years of education: Not on file  . Highest education level: Not on file  Occupational History  . Occupation: semi-retired    Comment: retired Youth worker, Insurance underwriter: [employer]  Tobacco Use  . Smoking status: Never Smoker  . Smokeless tobacco: Never Used  Substance and Sexual Activity  . Alcohol use: Yes    Alcohol/week: 0.0 standard drinks    Comment: rare -- wine -- weekend  . Drug use: No  . Sexual activity: Yes    Partners: Female, Male  Other Topics Concern  . Not on file  Social History Narrative   Exercise-- 3x a week on total gym and treadmill   Social Determinants of Health   Financial Resource Strain: Low Risk   . Difficulty of Paying Living Expenses: Not hard at all  Food Insecurity: No Food Insecurity  .  Worried About Charity fundraiser in the Last Year: Never true  . Ran Out of Food in the Last Year: Never true  Transportation Needs: No Transportation Needs  . Lack of Transportation (Medical): No  . Lack of Transportation (Non-Medical): No  Physical Activity: Not on file  Stress: Not on file  Social Connections: Not on file  Intimate Partner Violence: Not on file    Outpatient Medications Prior to Visit  Medication Sig Dispense Refill  . amLODipine (NORVASC) 5 MG tablet TAKE 1 TABLET DAILY 90 tablet 3  . aspirin EC 81 MG tablet Take 81 mg by mouth daily.    Marland Kitchen atorvastatin (LIPITOR) 10 MG tablet TAKE 1 TABLET DAILY ( PLEASE MAKE APPOINTMENT FOR FURTHER EVALUATION AND/OR LAB TESTING BEFORE FURTHER REFILLS ) 30 tablet 0  . fenofibrate 160 MG tablet TAKE 1 TABLET DAILY 90 tablet 3  . ibuprofen (ADVIL) 200 MG tablet     . Omega-3 Fatty Acids (FISH OIL) 1000 MG CAPS Take by mouth daily.    Marland Kitchen omeprazole (PRILOSEC) 20 MG  capsule Take 20 mg by mouth daily.    Marland Kitchen losartan (COZAAR) 50 MG tablet Take 1 tablet (50 mg total) by mouth daily. 90 tablet 3  . COVID-19 mRNA vaccine, Moderna, 100 MCG/0.5ML injection INJECT AS DIRECTED (Patient not taking: Reported on 01/20/2021) .25 mL 0  . Docusate Sodium (COLACE PO) Take by mouth daily. (Patient not taking: Reported on 01/20/2021)     Facility-Administered Medications Prior to Visit  Medication Dose Route Frequency Provider Last Rate Last Admin  . 0.9 %  sodium chloride infusion  500 mL Intravenous Continuous Ladene Artist, MD        No Known Allergies  Review of Systems  Constitutional: Negative for chills, fever and malaise/fatigue.  HENT: Negative for congestion and hearing loss.   Eyes: Negative for discharge.  Respiratory: Negative for cough, sputum production and shortness of breath.   Cardiovascular: Negative for chest pain, palpitations and leg swelling.  Gastrointestinal: Negative for abdominal pain, blood in stool, constipation,  diarrhea, heartburn, nausea and vomiting.  Genitourinary: Negative for dysuria, frequency, hematuria and urgency.  Musculoskeletal: Negative for back pain, falls and myalgias.  Skin: Negative for rash.  Neurological: Negative for dizziness, sensory change, loss of consciousness, weakness and headaches.  Endo/Heme/Allergies: Negative for environmental allergies. Does not bruise/bleed easily.  Psychiatric/Behavioral: Negative for depression and suicidal ideas. The patient is not nervous/anxious and does not have insomnia.        Objective:    Physical Exam Vitals and nursing note reviewed.  Constitutional:      General: He is sleeping.     Appearance: He is well-developed.  HENT:     Head: Normocephalic and atraumatic.  Eyes:     Pupils: Pupils are equal, round, and reactive to light.  Neck:     Thyroid: No thyromegaly.  Cardiovascular:     Rate and Rhythm: Normal rate and regular rhythm.     Heart sounds: No murmur heard.   Pulmonary:     Effort: Pulmonary effort is normal. No respiratory distress.     Breath sounds: Normal breath sounds. No wheezing or rales.  Chest:     Chest wall: No tenderness.  Musculoskeletal:        General: No tenderness.     Cervical back: Normal range of motion and neck supple.  Skin:    General: Skin is warm and dry.  Neurological:     Mental Status: He is oriented to person, place, and time.  Psychiatric:        Behavior: Behavior normal.        Thought Content: Thought content normal.        Judgment: Judgment normal.     BP 122/70 (BP Location: Right Arm, Patient Position: Sitting, Cuff Size: Normal)   Pulse 77   Temp 98 F (36.7 C) (Oral)   Resp 18   Ht 5\' 10"  (1.778 m)   Wt 211 lb 6.4 oz (95.9 kg)   SpO2 96%   BMI 30.33 kg/m  Wt Readings from Last 3 Encounters:  01/20/21 211 lb 6.4 oz (95.9 kg)  06/05/20 205 lb 9.6 oz (93.3 kg)  08/08/18 209 lb 9.6 oz (95.1 kg)    Diabetic Foot Exam - Simple   No data filed    Lab  Results  Component Value Date   WBC 5.2 08/08/2018   HGB 13.9 08/08/2018   HCT 40.3 08/08/2018   PLT 284.0 08/08/2018   GLUCOSE 96 01/20/2021   CHOL 116 01/20/2021  TRIG 83.0 01/20/2021   HDL 43.10 01/20/2021   LDLDIRECT 81.0 08/20/2016   LDLCALC 57 01/20/2021   ALT 28 01/20/2021   AST 28 01/20/2021   NA 139 01/20/2021   K 4.0 01/20/2021   CL 102 01/20/2021   CREATININE 1.18 01/20/2021   BUN 14 01/20/2021   CO2 30 01/20/2021   PSA 1.24 02/10/2018   HGBA1C 6.3 01/20/2021    No results found for: TSH Lab Results  Component Value Date   WBC 5.2 08/08/2018   HGB 13.9 08/08/2018   HCT 40.3 08/08/2018   MCV 86.1 08/08/2018   PLT 284.0 08/08/2018   Lab Results  Component Value Date   NA 139 01/20/2021   K 4.0 01/20/2021   CO2 30 01/20/2021   GLUCOSE 96 01/20/2021   BUN 14 01/20/2021   CREATININE 1.18 01/20/2021   BILITOT 0.5 01/20/2021   ALKPHOS 35 (L) 01/20/2021   AST 28 01/20/2021   ALT 28 01/20/2021   PROT 6.6 01/20/2021   ALBUMIN 4.3 01/20/2021   CALCIUM 9.9 01/20/2021   GFR 62.40 01/20/2021   Lab Results  Component Value Date   CHOL 116 01/20/2021   Lab Results  Component Value Date   HDL 43.10 01/20/2021   Lab Results  Component Value Date   LDLCALC 57 01/20/2021   Lab Results  Component Value Date   TRIG 83.0 01/20/2021   Lab Results  Component Value Date   CHOLHDL 3 01/20/2021   Lab Results  Component Value Date   HGBA1C 6.3 01/20/2021       Assessment & Plan:   Problem List Items Addressed This Visit      Unprioritized   HTN (hypertension)    Well controlled, no changes to meds. Encouraged heart healthy diet such as the DASH diet and exercise as tolerated.       Relevant Medications   losartan (COZAAR) 50 MG tablet   Hyperlipidemia LDL goal <100    Encouraged heart healthy diet, increase exercise, avoid trans fats, consider a krill oil cap daily      Relevant Medications   losartan (COZAAR) 50 MG tablet    Other Visit  Diagnoses    Hyperlipidemia, unspecified hyperlipidemia type    -  Primary   Relevant Medications   losartan (COZAAR) 50 MG tablet   Other Relevant Orders   Lipid panel (Completed)   Comprehensive metabolic panel (Completed)   Essential hypertension       Relevant Medications   losartan (COZAAR) 50 MG tablet   Other Relevant Orders   Lipid panel (Completed)   Comprehensive metabolic panel (Completed)   Hyperglycemia       Relevant Orders   Hemoglobin A1c (Completed)   Neuropathy       Relevant Orders   Vitamin B12 (Completed)   Abnormal gait       Relevant Orders   Vitamin B12 (Completed)      I have discontinued Gwyndolyn Saxon Hulme's Docusate Sodium (COLACE PO) and COVID-19 mRNA vaccine (Moderna). I am also having him maintain his omeprazole, Fish Oil, aspirin EC, ibuprofen, amLODipine, fenofibrate, atorvastatin, and losartan. We will continue to administer sodium chloride.  Meds ordered this encounter  Medications  . losartan (COZAAR) 50 MG tablet    Sig: Take 1 tablet (50 mg total) by mouth daily.    Dispense:  90 tablet    Refill:  3     Ann Held, DO

## 2021-01-20 NOTE — Patient Instructions (Signed)

## 2021-01-21 ENCOUNTER — Encounter: Payer: Self-pay | Admitting: Family Medicine

## 2021-01-21 LAB — LIPID PANEL
Cholesterol: 116 mg/dL (ref 0–200)
HDL: 43.1 mg/dL (ref >39.00)
LDL Cholesterol: 57 mg/dL (ref 0–99)
NonHDL: 73.23
Total CHOL/HDL Ratio: 3
Triglycerides: 83 mg/dL (ref 0.0–149.0)
VLDL: 16.6 mg/dL (ref 0.0–40.0)

## 2021-01-21 LAB — COMPREHENSIVE METABOLIC PANEL WITH GFR
ALT: 28 U/L (ref 0–53)
AST: 28 U/L (ref 0–37)
Albumin: 4.3 g/dL (ref 3.5–5.2)
Alkaline Phosphatase: 35 U/L — ABNORMAL LOW (ref 39–117)
BUN: 14 mg/dL (ref 6–23)
CO2: 30 meq/L (ref 19–32)
Calcium: 9.9 mg/dL (ref 8.4–10.5)
Chloride: 102 meq/L (ref 96–112)
Creatinine, Ser: 1.18 mg/dL (ref 0.40–1.50)
GFR: 62.4 mL/min
Glucose, Bld: 96 mg/dL (ref 70–99)
Potassium: 4 meq/L (ref 3.5–5.1)
Sodium: 139 meq/L (ref 135–145)
Total Bilirubin: 0.5 mg/dL (ref 0.2–1.2)
Total Protein: 6.6 g/dL (ref 6.0–8.3)

## 2021-01-21 LAB — HEMOGLOBIN A1C: Hgb A1c MFr Bld: 6.3 % (ref 4.6–6.5)

## 2021-01-21 LAB — VITAMIN B12: Vitamin B-12: 281 pg/mL (ref 211–911)

## 2021-01-21 NOTE — Assessment & Plan Note (Signed)
Encouraged heart healthy diet, increase exercise, avoid trans fats, consider a krill oil cap daily 

## 2021-01-21 NOTE — Assessment & Plan Note (Signed)
Well controlled, no changes to meds. Encouraged heart healthy diet such as the DASH diet and exercise as tolerated.  °

## 2021-01-22 NOTE — Telephone Encounter (Signed)
Noted  

## 2021-01-27 ENCOUNTER — Other Ambulatory Visit (HOSPITAL_BASED_OUTPATIENT_CLINIC_OR_DEPARTMENT_OTHER): Payer: Self-pay

## 2021-01-27 MED ORDER — COVID-19 MRNA VACC (MODERNA) 100 MCG/0.5ML IM SUSP
INTRAMUSCULAR | 0 refills | Status: DC
Start: 2021-01-20 — End: 2021-11-01
  Filled 2021-01-27: qty 0.25, 1d supply, fill #0

## 2021-02-10 ENCOUNTER — Other Ambulatory Visit (HOSPITAL_BASED_OUTPATIENT_CLINIC_OR_DEPARTMENT_OTHER): Payer: Self-pay

## 2021-03-15 ENCOUNTER — Encounter: Payer: Self-pay | Admitting: Family Medicine

## 2021-03-15 DIAGNOSIS — I10 Essential (primary) hypertension: Secondary | ICD-10-CM

## 2021-03-17 ENCOUNTER — Other Ambulatory Visit: Payer: Self-pay

## 2021-03-17 MED ORDER — ATORVASTATIN CALCIUM 10 MG PO TABS: ORAL_TABLET | ORAL | 0 refills | Status: DC

## 2021-03-17 MED ORDER — LOSARTAN POTASSIUM 50 MG PO TABS
50.0000 mg | ORAL_TABLET | Freq: Every day | ORAL | 3 refills | Status: DC
Start: 2021-03-17 — End: 2022-02-12

## 2021-03-31 ENCOUNTER — Encounter: Payer: Self-pay | Admitting: Dermatology

## 2021-03-31 ENCOUNTER — Ambulatory Visit (INDEPENDENT_AMBULATORY_CARE_PROVIDER_SITE_OTHER): Payer: Medicare Other | Admitting: Dermatology

## 2021-03-31 ENCOUNTER — Other Ambulatory Visit: Payer: Self-pay

## 2021-03-31 DIAGNOSIS — Z1283 Encounter for screening for malignant neoplasm of skin: Secondary | ICD-10-CM | POA: Diagnosis not present

## 2021-03-31 DIAGNOSIS — L720 Epidermal cyst: Secondary | ICD-10-CM | POA: Diagnosis not present

## 2021-03-31 DIAGNOSIS — L821 Other seborrheic keratosis: Secondary | ICD-10-CM | POA: Diagnosis not present

## 2021-03-31 DIAGNOSIS — D1801 Hemangioma of skin and subcutaneous tissue: Secondary | ICD-10-CM

## 2021-03-31 NOTE — Progress Notes (Signed)
   Follow-Up Visit   Subjective  David Nielsen is a 70 y.o. male who presents for the following: Annual Exam (No new concerns).  Seborrheic keratoses on the back.  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  Multiple cherry angiomas. Location:  Duration:  Quality:  Associated Signs/Symptoms: Modifying Factors:  Severity:  Timing: Context:   Objective  Well appearing patient in no apparent distress; mood and affect are within normal limits.   All skin waist up examined.   Assessment & Plan    Encounter for screening for malignant neoplasm of skin      I, Lavonna Monarch, MD, have reviewed all documentation for this visit.  The documentation on 03/31/21 for the exam, diagnosis, procedures, and orders are all accurate and complete.

## 2021-04-04 ENCOUNTER — Encounter: Payer: Self-pay | Admitting: Dermatology

## 2021-04-08 MED ORDER — AMLODIPINE BESYLATE 5 MG PO TABS: ORAL_TABLET | ORAL | 1 refills | Status: DC

## 2021-04-08 NOTE — Addendum Note (Signed)
Addended byDamita Dunnings D on: 04/08/2021 03:54 PM   Modules accepted: Orders

## 2021-04-29 ENCOUNTER — Encounter: Payer: Self-pay | Admitting: Family Medicine

## 2021-04-29 MED ORDER — FENOFIBRATE 160 MG PO TABS: 160.0000 mg | ORAL_TABLET | Freq: Every day | ORAL | 1 refills | Status: DC

## 2021-04-30 MED ORDER — ATORVASTATIN CALCIUM 10 MG PO TABS: ORAL_TABLET | ORAL | 1 refills | Status: DC

## 2021-04-30 NOTE — Addendum Note (Signed)
Addended byDamita Dunnings D on: 04/30/2021 04:23 PM   Modules accepted: Orders

## 2021-05-12 ENCOUNTER — Encounter: Payer: Self-pay | Admitting: Family Medicine

## 2021-05-18 ENCOUNTER — Other Ambulatory Visit: Payer: Self-pay | Admitting: Family Medicine

## 2021-05-18 MED ORDER — BENZONATATE 100 MG PO CAPS: 200.0000 mg | ORAL_CAPSULE | Freq: Three times a day (TID) | ORAL | 0 refills | Status: DC | PRN

## 2021-05-18 NOTE — Telephone Encounter (Signed)
Pt requesting cough medicine. Would you like a virtual? Please advise

## 2021-06-18 ENCOUNTER — Other Ambulatory Visit (HOSPITAL_BASED_OUTPATIENT_CLINIC_OR_DEPARTMENT_OTHER): Payer: Self-pay

## 2021-07-09 ENCOUNTER — Encounter: Payer: Self-pay | Admitting: Family Medicine

## 2021-07-09 ENCOUNTER — Other Ambulatory Visit: Payer: Self-pay | Admitting: Family Medicine

## 2021-07-09 ENCOUNTER — Ambulatory Visit (INDEPENDENT_AMBULATORY_CARE_PROVIDER_SITE_OTHER): Payer: Medicare Other

## 2021-07-09 ENCOUNTER — Other Ambulatory Visit: Payer: Self-pay

## 2021-07-09 DIAGNOSIS — I10 Essential (primary) hypertension: Secondary | ICD-10-CM

## 2021-07-09 DIAGNOSIS — Z23 Encounter for immunization: Secondary | ICD-10-CM

## 2021-07-29 ENCOUNTER — Telehealth: Payer: Self-pay | Admitting: Family Medicine

## 2021-07-29 NOTE — Telephone Encounter (Signed)
Left message for patient to call back and schedule Medicare Annual Wellness Visit (AWV) in office.   If not able to come in office, please offer to do virtually or by telephone.  Left office number and my jabber #[phone].  Last AWV:06/17/2020  Please schedule at anytime with Nurse Health Advisor.   

## 2021-09-28 ENCOUNTER — Encounter: Payer: Self-pay | Admitting: Family Medicine

## 2021-09-28 DIAGNOSIS — I10 Essential (primary) hypertension: Secondary | ICD-10-CM

## 2021-09-28 MED ORDER — AMLODIPINE BESYLATE 5 MG PO TABS
ORAL_TABLET | ORAL | 0 refills | Status: DC
Start: 2021-09-28 — End: 2021-09-29

## 2021-09-29 MED ORDER — AMLODIPINE BESYLATE 5 MG PO TABS: ORAL_TABLET | ORAL | 0 refills | Status: DC

## 2021-10-01 ENCOUNTER — Telehealth: Payer: Self-pay | Admitting: Family Medicine

## 2021-10-01 NOTE — Telephone Encounter (Signed)
I attempted to leave message for patient to call back and schedule Medicare Annual Wellness Visit (AWV) in office. No voice mail.  If not able to come in office, please offer to do virtually or by telephone.  Left office number and my jabber [phone].  Last AWV:06/17/2020  Please schedule at anytime with Nurse Health Advisor.

## 2021-10-23 ENCOUNTER — Other Ambulatory Visit: Payer: Self-pay | Admitting: Family Medicine

## 2021-10-29 ENCOUNTER — Encounter: Payer: Self-pay | Admitting: Family Medicine

## 2021-10-29 ENCOUNTER — Ambulatory Visit (INDEPENDENT_AMBULATORY_CARE_PROVIDER_SITE_OTHER): Payer: Medicare Other | Admitting: Family Medicine

## 2021-10-29 VITALS — BP 132/80 | HR 68 | Temp 97.8°F | Resp 16 | Ht 70.0 in | Wt 206.4 lb

## 2021-10-29 DIAGNOSIS — R351 Nocturia: Secondary | ICD-10-CM | POA: Diagnosis not present

## 2021-10-29 DIAGNOSIS — I10 Essential (primary) hypertension: Secondary | ICD-10-CM

## 2021-10-29 DIAGNOSIS — G629 Polyneuropathy, unspecified: Secondary | ICD-10-CM

## 2021-10-29 DIAGNOSIS — E785 Hyperlipidemia, unspecified: Secondary | ICD-10-CM | POA: Diagnosis not present

## 2021-10-29 MED ORDER — TRAMADOL HCL 50 MG PO TABS: 50.0000 mg | ORAL_TABLET | Freq: Three times a day (TID) | ORAL | 0 refills | Status: AC | PRN

## 2021-10-29 NOTE — Progress Notes (Signed)
Subjective:   By signing my name below, I, David Nielsen, attest that this documentation has been prepared under the direction and in the presence of Dr. Roma Schanz, DO. 10/29/2021    Patient ID: David Nielsen, male    DOB: 04-21-1951, 71 y.o.   MRN: [id number]  Chief Complaint  Patient presents with   Hypertension   Hyperlipidemia   Follow-up    Hypertension Pertinent negatives include no blurred vision, chest pain, headaches, malaise/fatigue, palpitations or shortness of breath.  Hyperlipidemia Pertinent negatives include no chest pain or shortness of breath.   Patient is in today for a office visit.  He is no longer taking tessalon Perles at this time. He has no new issues with cough.  He is participating in regular exercise by walking. He reports occasionally having pain from his neuropathy while exercising. He is interested in taking pain medication to manage his pain.  He is completing lab work during this visit.  He has 4 moderna Covid-19 vaccines. He is UTD on flu vaccines this year. He is eligible for pneumonia vaccines and is not interested in receiving it at this time. He is eligible for the shingles vaccine and is interested in receiving it at his pharmacy at a later time.    Past Medical History:  Diagnosis Date   Chicken pox    GERD (gastroesophageal reflux disease)    H/O gastroesophageal reflux (GERD)    HTN (hypertension)    Hyperlipidemia    PVC's (premature ventricular contractions)     Past Surgical History:  Procedure Laterality Date   COLONOSCOPY     none      Family History  Problem Relation Age of Onset   Alcohol abuse Father 71       pneumonia   Stroke Mother        Smoker   Cancer Sister        breast   Colon polyps Brother    Alcohol abuse Son    Stomach cancer Neg Hx    Rectal cancer Neg Hx    Esophageal cancer Neg Hx    Liver cancer Neg Hx    Colon cancer Neg Hx     Social History    Socioeconomic History   Marital status: Married    Spouse name: Not on file   Number of children: 2   Years of education: Not on file   Highest education level: Not on file  Occupational History   Occupation: semi-retired    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: [place]  Tobacco Use   Smoking status: Never   Smokeless tobacco: Never  Substance and Sexual Activity   Alcohol use: Yes    Alcohol/week: 0.0 standard drinks    Comment: rare -- wine -- weekend   Drug use: No   Sexual activity: Yes    Partners: Female, Male  Other Topics Concern   Not on file  Social History Narrative   Exercise-- 3x a week on total gym and treadmill   Social Determinants of Health   Financial Resource Strain: Not on file  Food Insecurity: Not on file  Transportation Needs: Not on file  Physical Activity: Not on file  Stress: Not on file  Social Connections: Not on file  Intimate Partner Violence: Not on file    Outpatient Medications Prior to Visit  Medication Sig Dispense Refill   amLODipine (NORVASC) 5 MG tablet TAKE 1 TABLET BY MOUTH DAILY 90  tablet 0   aspirin EC 81 MG tablet Take 81 mg by mouth daily.     atorvastatin (LIPITOR) 10 MG tablet TAKE 1 TABLET DAILY 90 tablet 1   fenofibrate 160 MG tablet TAKE 1 TABLET(160 MG) BY MOUTH DAILY 90 tablet 0   ibuprofen (ADVIL) 200 MG tablet      losartan (COZAAR) 50 MG tablet Take 1 tablet (50 mg total) by mouth daily. 90 tablet 3   omeprazole (PRILOSEC) 20 MG capsule Take 20 mg by mouth daily.     benzonatate (TESSALON PERLES) 100 MG capsule Take 2 capsules (200 mg total) by mouth 3 (three) times daily as needed for cough. 30 capsule 0   COVID-19 mRNA vaccine, Moderna, 100 MCG/0.5ML injection Inject into the muscle. 0.25 mL 0   Omega-3 Fatty Acids (FISH OIL) 1000 MG CAPS Take by mouth daily.     Facility-Administered Medications Prior to Visit  Medication Dose Route Frequency Provider  Last Rate Last Admin   0.9 %  sodium chloride infusion  500 mL Intravenous Continuous Ladene Artist, MD        No Known Allergies  Review of Systems  Constitutional:  Negative for fever and malaise/fatigue.  HENT:  Negative for congestion.   Eyes:  Negative for blurred vision.  Respiratory:  Negative for shortness of breath.   Cardiovascular:  Negative for chest pain, palpitations and leg swelling.  Gastrointestinal:  Negative for abdominal pain, blood in stool and nausea.  Genitourinary:  Negative for dysuria and frequency.  Musculoskeletal:  Negative for falls.  Skin:  Negative for rash.  Neurological:  Negative for dizziness, loss of consciousness and headaches.  Endo/Heme/Allergies:  Negative for environmental allergies.  Psychiatric/Behavioral:  Negative for depression. The patient is not nervous/anxious.       Objective:    Physical Exam Vitals and nursing note reviewed.  Constitutional:      General: He is not in acute distress.    Appearance: Normal appearance. He is not ill-appearing.  HENT:     Head: Normocephalic and atraumatic.     Right Ear: External ear normal.     Left Ear: External ear normal.  Eyes:     Extraocular Movements: Extraocular movements intact.     Pupils: Pupils are equal, round, and reactive to light.  Cardiovascular:     Rate and Rhythm: Normal rate and regular rhythm.     Heart sounds: Normal heart sounds. No murmur heard.   No gallop.  Pulmonary:     Effort: Pulmonary effort is normal. No respiratory distress.     Breath sounds: Normal breath sounds. No wheezing or rales.  Skin:    General: Skin is warm and dry.  Neurological:     Mental Status: He is alert and oriented to person, place, and time.  Psychiatric:        Behavior: Behavior normal.        Judgment: Judgment normal.    BP 132/80 (BP Location: Left Arm, Patient Position: Sitting, Cuff Size: Normal)    Pulse 68    Temp 97.8 F (36.6 C) (Oral)    Resp 16    Ht 5\' 10"   (1.778 m)    Wt 206 lb 6.4 oz (93.6 kg)    SpO2 98%    BMI 29.62 kg/m  Wt Readings from Last 3 Encounters:  10/29/21 206 lb 6.4 oz (93.6 kg)  01/20/21 211 lb 6.4 oz (95.9 kg)  06/05/20 205 lb 9.6 oz (93.3 kg)  Diabetic Foot Exam - Simple   No data filed    Lab Results  Component Value Date   WBC 5.2 08/08/2018   HGB 13.9 08/08/2018   HCT 40.3 08/08/2018   PLT 284.0 08/08/2018   GLUCOSE 106 (H) 10/29/2021   CHOL 120 10/29/2021   TRIG 126.0 10/29/2021   HDL 45.60 10/29/2021   LDLDIRECT 81.0 08/20/2016   LDLCALC 49 10/29/2021   ALT 21 10/29/2021   AST 24 10/29/2021   NA 139 10/29/2021   K 3.9 10/29/2021   CL 101 10/29/2021   CREATININE 1.08 10/29/2021   BUN 13 10/29/2021   CO2 31 10/29/2021   PSA 1.65 10/29/2021   HGBA1C 6.3 01/20/2021    No results found for: TSH Lab Results  Component Value Date   WBC 5.2 08/08/2018   HGB 13.9 08/08/2018   HCT 40.3 08/08/2018   MCV 86.1 08/08/2018   PLT 284.0 08/08/2018   Lab Results  Component Value Date   NA 139 10/29/2021   K 3.9 10/29/2021   CO2 31 10/29/2021   GLUCOSE 106 (H) 10/29/2021   BUN 13 10/29/2021   CREATININE 1.08 10/29/2021   BILITOT 0.5 10/29/2021   ALKPHOS 32 (L) 10/29/2021   AST 24 10/29/2021   ALT 21 10/29/2021   PROT 6.5 10/29/2021   ALBUMIN 4.3 10/29/2021   CALCIUM 9.8 10/29/2021   GFR 69.02 10/29/2021   Lab Results  Component Value Date   CHOL 120 10/29/2021   Lab Results  Component Value Date   HDL 45.60 10/29/2021   Lab Results  Component Value Date   LDLCALC 49 10/29/2021   Lab Results  Component Value Date   TRIG 126.0 10/29/2021   Lab Results  Component Value Date   CHOLHDL 3 10/29/2021   Lab Results  Component Value Date   HGBA1C 6.3 01/20/2021       Assessment & Plan:   Problem List Items Addressed This Visit       Unprioritized   Nocturia   Relevant Orders   PSA (Completed)   HTN (hypertension) - Primary    Well controlled, no changes to meds. Encouraged  heart healthy diet such as the DASH diet and exercise as tolerated.        Relevant Orders   Comprehensive metabolic panel (Completed)   Lipid panel (Completed)   Hyperlipidemia LDL goal <100    Encourage heart healthy diet such as MIND or DASH diet, increase exercise, avoid trans fats, simple carbohydrates and processed foods, consider a krill or fish or flaxseed oil cap daily.       Other Visit Diagnoses     Hyperlipidemia, unspecified hyperlipidemia type       Relevant Orders   Comprehensive metabolic panel (Completed)   Lipid panel (Completed)   Neuropathy       Relevant Medications   traMADol (ULTRAM) 50 MG tablet        Meds ordered this encounter  Medications   traMADol (ULTRAM) 50 MG tablet    Sig: Take 1 tablet (50 mg total) by mouth every 8 (eight) hours as needed for up to 5 days.    Dispense:  15 tablet    Refill:  0    I, Dr. Roma Schanz, DO, personally preformed the services described in this documentation.  All medical record entries made by the scribe were at my direction and in my presence.  I have reviewed the chart and discharge instructions (if applicable) and agree that the record reflects  my personal performance and is accurate and complete. 10/29/2021   I,David Nielsen,acting as a scribe for Ann Held, DO.,have documented all relevant documentation on the behalf of Ann Held, DO,as directed by  Ann Held, DO while in the presence of Ann Held, DO.   Ann Held, DO

## 2021-10-29 NOTE — Patient Instructions (Signed)

## 2021-10-30 LAB — COMPREHENSIVE METABOLIC PANEL
ALT: 21 U/L (ref 0–53)
AST: 24 U/L (ref 0–37)
Albumin: 4.3 g/dL (ref 3.5–5.2)
Alkaline Phosphatase: 32 U/L — ABNORMAL LOW (ref 39–117)
BUN: 13 mg/dL (ref 6–23)
CO2: 31 mEq/L (ref 19–32)
Calcium: 9.8 mg/dL (ref 8.4–10.5)
Chloride: 101 mEq/L (ref 96–112)
Creatinine, Ser: 1.08 mg/dL (ref 0.40–1.50)
GFR: 69.02 mL/min (ref >60.00)
Glucose, Bld: 106 mg/dL — ABNORMAL HIGH (ref 70–99)
Potassium: 3.9 mEq/L (ref 3.5–5.1)
Sodium: 139 mEq/L (ref 135–145)
Total Bilirubin: 0.5 mg/dL (ref 0.2–1.2)
Total Protein: 6.5 g/dL (ref 6.0–8.3)

## 2021-10-30 LAB — LIPID PANEL
Cholesterol: 120 mg/dL (ref 0–200)
HDL: 45.6 mg/dL (ref >39.00)
LDL Cholesterol: 49 mg/dL (ref 0–99)
NonHDL: 74.28
Total CHOL/HDL Ratio: 3
Triglycerides: 126 mg/dL (ref 0.0–149.0)
VLDL: 25.2 mg/dL (ref 0.0–40.0)

## 2021-10-30 LAB — PSA: PSA: 1.65 ng/mL (ref 0.10–4.00)

## 2021-11-01 NOTE — Assessment & Plan Note (Signed)
Encourage heart healthy diet such as MIND or DASH diet, increase exercise, avoid trans fats, simple carbohydrates and processed foods, consider a krill or fish or flaxseed oil cap daily.  °

## 2021-11-01 NOTE — Assessment & Plan Note (Signed)
Well controlled, no changes to meds. Encouraged heart healthy diet such as the DASH diet and exercise as tolerated.  °

## 2021-11-13 ENCOUNTER — Other Ambulatory Visit (HOSPITAL_COMMUNITY): Payer: Self-pay

## 2021-11-26 ENCOUNTER — Telehealth: Payer: Self-pay | Admitting: Family Medicine

## 2021-11-26 NOTE — Telephone Encounter (Signed)
I attempted to leave a message for patient to call back and schedule Medicare Annual Wellness Visit (AWV) in office. Voice mail is full.  If not able to come in office, please offer to do virtually or by telephone.  Left office number and my jabber [phone].  Last AWV:06/17/2020  Please schedule at anytime with Nurse Health Advisor.

## 2022-01-14 ENCOUNTER — Encounter: Payer: Self-pay | Admitting: Family Medicine

## 2022-01-14 DIAGNOSIS — I10 Essential (primary) hypertension: Secondary | ICD-10-CM

## 2022-01-15 MED ORDER — AMLODIPINE BESYLATE 5 MG PO TABS: ORAL_TABLET | ORAL | 3 refills | Status: DC

## 2022-01-21 ENCOUNTER — Ambulatory Visit (INDEPENDENT_AMBULATORY_CARE_PROVIDER_SITE_OTHER): Payer: Medicare Other | Admitting: Family Medicine

## 2022-01-21 ENCOUNTER — Encounter: Payer: Self-pay | Admitting: Family Medicine

## 2022-01-21 VITALS — BP 112/60 | HR 77 | Temp 98.2°F | Resp 18 | Ht 70.0 in | Wt 206.8 lb

## 2022-01-21 DIAGNOSIS — R591 Generalized enlarged lymph nodes: Secondary | ICD-10-CM

## 2022-01-21 LAB — CBC WITH DIFFERENTIAL/PLATELET
Absolute Monocytes: 459 cells/uL (ref 200–950)
Basophils Absolute: 50 cells/uL (ref 0–200)
Basophils Relative: 1.1 %
Eosinophils Absolute: 171 cells/uL (ref 15–500)
Eosinophils Relative: 3.8 %
HCT: 38.9 % (ref 38.5–50.0)
Hemoglobin: 13.2 g/dL (ref 13.2–17.1)
Lymphs Abs: 1076 cells/uL (ref 850–3900)
MCH: 29.5 pg (ref 27.0–33.0)
MCHC: 33.9 g/dL (ref 32.0–36.0)
MCV: 86.8 fL (ref 80.0–100.0)
MPV: 9.8 fL (ref 7.5–12.5)
Monocytes Relative: 10.2 %
Neutro Abs: 2745 cells/uL (ref 1500–7800)
Neutrophils Relative %: 61 %
Platelets: 286 10*3/uL (ref 140–400)
RBC: 4.48 10*6/uL (ref 4.20–5.80)
RDW: 12.8 % (ref 11.0–15.0)
Total Lymphocyte: 23.9 %
WBC: 4.5 10*3/uL (ref 3.8–10.8)

## 2022-01-21 NOTE — Patient Instructions (Signed)
Lymphadenopathy ?Lymphadenopathy means that your lymph glands are swollen or larger than normal. Lymph glands, also called lymph nodes, are collections of tissue that filter excess fluid, bacteria, viruses, and waste from your bloodstream. They are part of your body's disease-fighting system (immune system), which protects your body from germs. ?There may be different causes of lymphadenopathy, depending on where it is in your body. Some types go away on their own. Lymphadenopathy can occur anywhere that you have lymph glands, including these areas: ?Neck (cervical lymphadenopathy). ?Chest (mediastinal lymphadenopathy). ?Lungs (hilar lymphadenopathy). ?Underarms (axillary lymphadenopathy). ?Groin (inguinal lymphadenopathy). ?When your immune system responds to germs, infection-fighting cells and fluid build up in your lymph glands. This causes some swelling and enlargement. If the lymph nodes do not go back to normal size after you have an infection or disease, your health care provider may do tests. These tests help to monitor your condition and find the reason why the glands are still swollen and enlarged. ?Follow these instructions at home: ? ?Get plenty of rest. ?Your health care provider may recommend over-the-counter medicines for pain. Take over-the-counter and prescription medicines only as told by your health care provider. ?If directed, apply heat to swollen lymph glands as often as told by your health care provider. Use the heat source that your health care provider recommends, such as a moist heat pack or a heating pad. ?Place a towel between your skin and the heat source. ?Leave the heat on for 20-30 minutes. ?Remove the heat if your skin turns bright red. This is especially important if you are unable to feel pain, heat, or cold. You may have a greater risk of getting burned. ?Check your affected lymph glands every day for changes. Check other lymph gland areas as told by your health care provider.  Check for changes such as: ?More swelling. ?Sudden increase in size. ?Redness or pain. ?Hardness. ?Keep all follow-up visits. This is important. ?Contact a health care provider if you have: ?Lymph glands that: ?Are still swollen after 2 weeks. ?Have suddenly gotten bigger or the swelling spreads. ?Are red, painful, or hard. ?Fluid leaking from the skin near an enlarged lymph gland. ?Problems with breathing. ?A fever, chills, or night sweats. ?Fatigue. ?A sore throat. ?Pain in your abdomen. ?Weight loss. ?Get help right away if you have: ?Severe pain. ?Chest pain. ?Shortness of breath. ?These symptoms may represent a serious problem that is an emergency. Do not wait to see if the symptoms will go away. Get medical help right away. Call your local emergency services (911 in the [place] Do not drive yourself to the hospital. ?Summary ?Lymphadenopathy means that your lymph glands are swollen or larger than normal. ?Lymph glands, also called lymph nodes, are collections of tissue that filter excess fluid, bacteria, viruses, and waste from the bloodstream. They are part of your body's disease-fighting system (immune system). ?Lymphadenopathy can occur anywhere that you have lymph glands. ?If the lymph nodes do not go back to normal size after you have an infection or disease, your health care provider may do tests to monitor your condition and find the reason why the glands are still swollen and enlarged. ?Check your affected lymph glands every day for changes. Check other lymph gland areas as told by your health care provider. ?This information is not intended to replace advice given to you by your health care provider. Make sure you discuss any questions you have with your health care provider. ?Document Revised: 07/30/2020 Document Reviewed: 07/30/2020 ?Elsevier Patient Education ? 2022  Elsevier Inc. ? ?

## 2022-01-21 NOTE — Addendum Note (Signed)
Addended by: Kem Boroughs D on: 01/21/2022 02:15 PM ? ? Modules accepted: Orders ? ?

## 2022-01-21 NOTE — Progress Notes (Signed)
? ?Subjective:  ? ?By signing my name below, I, David Nielsen, attest that this documentation has been prepared under the direction and in the presence of David Held, DO. 01/21/2022 ? ? ? Patient ID: David Nielsen, male    DOB: July 07, 1951, 71 y.o.   MRN: [id number] ? ?Chief Complaint  ?Patient presents with  ? Neck Concern  ?  Pt states having a lump on the back of his neck. Pt states occ pain but no discharge. Pt states having it for years.   ? ? ?HPI ?Patient is in today for a office visit.  ? ?He complains of a painful lump on the back right side neck for the past couple of years. His pain comes and goes. Sometimes the intervals can be months apart or they could be hours apart. His pain worsens when laying down in bed. He occasionally has pain while swallowing when the lump is flaring up. He found no change in its size. He denies having any fever.  ? ? ?Past Medical History:  ?Diagnosis Date  ? Chicken pox   ? GERD (gastroesophageal reflux disease)   ? H/O gastroesophageal reflux (GERD)   ? HTN (hypertension)   ? Hyperlipidemia   ? PVC's (premature ventricular contractions)   ? ? ?Past Surgical History:  ?Procedure Laterality Date  ? COLONOSCOPY    ? none    ? ? ?Family History  ?Problem Relation Age of Onset  ? Alcohol abuse Father 38  ?     pneumonia  ? Stroke Mother   ?     Smoker  ? Cancer Sister   ?     breast  ? Colon polyps Brother   ? Alcohol abuse Son   ? Stomach cancer Neg Hx   ? Rectal cancer Neg Hx   ? Esophageal cancer Neg Hx   ? Liver cancer Neg Hx   ? Colon cancer Neg Hx   ? ? ?Social History  ? ?Socioeconomic History  ? Marital status: Married  ?  Spouse name: Not on file  ? Number of children: 2  ? Years of education: Not on file  ? Highest education level: Not on file  ?Occupational History  ? Occupation: semi-retired  ?  Comment: retired Youth worker, [employer]  ?  Employer: [employer]  ?Tobacco Use  ? Smoking status: Never  ? Smokeless tobacco: Never  ?Substance and  Sexual Activity  ? Alcohol use: Yes  ?  Alcohol/week: 0.0 standard drinks  ?  Comment: rare -- wine -- weekend  ? Drug use: No  ? Sexual activity: Yes  ?  Partners: Female, Male  ?Other Topics Concern  ? Not on file  ?Social History Narrative  ? Exercise-- 3x a week on total gym and treadmill  ? ?Social Determinants of Health  ? ?Financial Resource Strain: Not on file  ?Food Insecurity: Not on file  ?Transportation Needs: Not on file  ?Physical Activity: Not on file  ?Stress: Not on file  ?Social Connections: Not on file  ?Intimate Partner Violence: Not on file  ? ? ?Outpatient Medications Prior to Visit  ?Medication Sig Dispense Refill  ? amLODipine (NORVASC) 5 MG tablet TAKE 1 TABLET BY MOUTH DAILY 90 tablet 3  ? aspirin EC 81 MG tablet Take 81 mg by mouth daily.    ? atorvastatin (LIPITOR) 10 MG tablet TAKE 1 TABLET DAILY 90 tablet 1  ? fenofibrate 160 MG tablet TAKE 1 TABLET(160 MG) BY MOUTH  DAILY 90 tablet 0  ? ibuprofen (ADVIL) 200 MG tablet     ? losartan (COZAAR) 50 MG tablet Take 1 tablet (50 mg total) by mouth daily. 90 tablet 3  ? omeprazole (PRILOSEC) 20 MG capsule Take 20 mg by mouth daily.    ? ?Facility-Administered Medications Prior to Visit  ?Medication Dose Route Frequency Provider Last Rate Last Admin  ? 0.9 %  sodium chloride infusion  500 mL Intravenous Continuous Ladene Artist, MD      ? ? ?No Known Allergies ? ?Review of Systems  ?Constitutional:  Negative for fever and malaise/fatigue.  ?HENT:  Negative for congestion.   ?Eyes:  Negative for blurred vision.  ?Respiratory:  Negative for cough and shortness of breath.   ?Cardiovascular:  Negative for chest pain, palpitations and leg swelling.  ?Gastrointestinal:  Negative for vomiting.  ?Musculoskeletal:  Positive for neck pain. Negative for back pain.  ?Skin:  Negative for rash.  ?     + lump R side neck   ?Neurological:  Negative for loss of consciousness and headaches.  ? ?   ?Objective:  ?  ?Physical Exam ?Vitals and nursing note  reviewed.  ?Constitutional:   ?   General: He is not in acute distress. ?   Appearance: Normal appearance. He is not ill-appearing.  ?HENT:  ?   Head: Normocephalic and atraumatic.  ?   Right Ear: External ear normal.  ?   Left Ear: External ear normal.  ?Eyes:  ?   Extraocular Movements: Extraocular movements intact.  ?   Pupils: Pupils are equal, round, and reactive to light.  ?Cardiovascular:  ?   Rate and Rhythm: Normal rate and regular rhythm.  ?   Heart sounds: Normal heart sounds. No murmur heard. ?  No gallop.  ?Pulmonary:  ?   Effort: Pulmonary effort is normal. No respiratory distress.  ?   Breath sounds: Normal breath sounds. No wheezing or rales.  ?Skin: ?   General: Skin is warm and dry.  ?   Comments: Small mass on right side posterior cervical---  ? Lymph node ?Non tender today  ?Neurological:  ?   Mental Status: He is alert and oriented to person, place, and time.  ?Psychiatric:     ?   Judgment: Judgment normal.  ? ? ?BP 112/60 (BP Location: Left Arm, Patient Position: Sitting, Cuff Size: Normal)   Pulse 77   Temp 98.2 ?F (36.8 ?C) (Oral)   Resp 18   Ht '5\' 10"'$  (1.778 m)   Wt 206 lb 12.8 oz (93.8 kg)   SpO2 98%   BMI 29.67 kg/m?  ?Wt Readings from Last 3 Encounters:  ?01/21/22 206 lb 12.8 oz (93.8 kg)  ?10/29/21 206 lb 6.4 oz (93.6 kg)  ?01/20/21 211 lb 6.4 oz (95.9 kg)  ? ? ?Diabetic Foot Exam - Simple   ?No data filed ?  ? ?Lab Results  ?Component Value Date  ? WBC 5.2 08/08/2018  ? HGB 13.9 08/08/2018  ? HCT 40.3 08/08/2018  ? PLT 284.0 08/08/2018  ? GLUCOSE 106 (H) 10/29/2021  ? CHOL 120 10/29/2021  ? TRIG 126.0 10/29/2021  ? HDL 45.60 10/29/2021  ? LDLDIRECT 81.0 08/20/2016  ? LDLCALC 49 10/29/2021  ? ALT 21 10/29/2021  ? AST 24 10/29/2021  ? NA 139 10/29/2021  ? K 3.9 10/29/2021  ? CL 101 10/29/2021  ? CREATININE 1.08 10/29/2021  ? BUN 13 10/29/2021  ? CO2 31 10/29/2021  ? PSA 1.65 10/29/2021  ?  HGBA1C 6.3 01/20/2021  ? ? ?No results found for: TSH ?Lab Results  ?Component Value Date  ?  WBC 5.2 08/08/2018  ? HGB 13.9 08/08/2018  ? HCT 40.3 08/08/2018  ? MCV 86.1 08/08/2018  ? PLT 284.0 08/08/2018  ? ?Lab Results  ?Component Value Date  ? NA 139 10/29/2021  ? K 3.9 10/29/2021  ? CO2 31 10/29/2021  ? GLUCOSE 106 (H) 10/29/2021  ? BUN 13 10/29/2021  ? CREATININE 1.08 10/29/2021  ? BILITOT 0.5 10/29/2021  ? ALKPHOS 32 (L) 10/29/2021  ? AST 24 10/29/2021  ? ALT 21 10/29/2021  ? PROT 6.5 10/29/2021  ? ALBUMIN 4.3 10/29/2021  ? CALCIUM 9.8 10/29/2021  ? GFR 69.02 10/29/2021  ? ?Lab Results  ?Component Value Date  ? CHOL 120 10/29/2021  ? ?Lab Results  ?Component Value Date  ? HDL 45.60 10/29/2021  ? ?Lab Results  ?Component Value Date  ? LDLCALC 49 10/29/2021  ? ?Lab Results  ?Component Value Date  ? TRIG 126.0 10/29/2021  ? ?Lab Results  ?Component Value Date  ? CHOLHDL 3 10/29/2021  ? ?Lab Results  ?Component Value Date  ? HGBA1C 6.3 01/20/2021  ? ? ?   ?Assessment & Plan:  ? ?Problem List Items Addressed This Visit   ? ?  ? Unprioritized  ? Lymphadenopathy - Primary  ?  US neck ?Check cbcd ?rto if worsens or no better  ?  ?  ? Relevant Orders  ? CBC with Differential/Platelet  ? US SOFT TISSUE HEAD & NECK (NON-THYROID)  ? ? ? ?No orders of the defined types were placed in this encounter. ? ? ?I, David Held, DO, personally preformed the services described in this documentation.  All medical record entries made by the scribe were at my direction and in my presence.  I have reviewed the chart and discharge instructions (if applicable) and agree that the record reflects my personal performance and is accurate and complete. 01/21/2022 ? ? ?Engineering geologist as a Education administrator for [employer], DO.,have documented all relevant documentation on the behalf of David Held, DO,as directed by  David Held, DO while in the presence of David Held, DO. ? ? ?David Held, DO ? ?

## 2022-01-21 NOTE — Assessment & Plan Note (Signed)
US neck ?Check cbcd ?rto if worsens or no better  ?

## 2022-01-22 ENCOUNTER — Other Ambulatory Visit: Payer: Self-pay | Admitting: Family Medicine

## 2022-01-22 ENCOUNTER — Ambulatory Visit (HOSPITAL_BASED_OUTPATIENT_CLINIC_OR_DEPARTMENT_OTHER)
Admission: RE | Admit: 2022-01-22 | Discharge: 2022-01-22 | Disposition: A | Discharge status: Home / Self Care | Payer: Medicare Other | Source: Ambulatory Visit | Attending: Family Medicine | Admitting: Family Medicine

## 2022-01-22 DIAGNOSIS — R591 Generalized enlarged lymph nodes: Secondary | ICD-10-CM | POA: Insufficient documentation

## 2022-01-22 DIAGNOSIS — R590 Localized enlarged lymph nodes: Secondary | ICD-10-CM | POA: Diagnosis not present

## 2022-01-22 DIAGNOSIS — E041 Nontoxic single thyroid nodule: Secondary | ICD-10-CM

## 2022-01-23 ENCOUNTER — Other Ambulatory Visit: Payer: Self-pay | Admitting: Family Medicine

## 2022-01-26 ENCOUNTER — Other Ambulatory Visit: Payer: Self-pay | Admitting: Family Medicine

## 2022-01-26 ENCOUNTER — Ambulatory Visit (HOSPITAL_BASED_OUTPATIENT_CLINIC_OR_DEPARTMENT_OTHER)
Admission: RE | Admit: 2022-01-26 | Discharge: 2022-01-26 | Disposition: A | Discharge status: Home / Self Care | Payer: Medicare Other | Source: Ambulatory Visit | Attending: Family Medicine | Admitting: Family Medicine

## 2022-01-26 DIAGNOSIS — E041 Nontoxic single thyroid nodule: Secondary | ICD-10-CM | POA: Insufficient documentation

## 2022-02-02 ENCOUNTER — Encounter: Payer: Self-pay | Admitting: Family Medicine

## 2022-02-02 NOTE — Addendum Note (Signed)
Addended by: Sanda Linger on: 02/02/2022 04:40 PM ? ? Modules accepted: Orders ? ?

## 2022-02-05 ENCOUNTER — Other Ambulatory Visit (HOSPITAL_COMMUNITY)
Admission: RE | Admit: 2022-02-05 | Discharge: 2022-02-05 | Disposition: A | Discharge status: Home / Self Care | Payer: Medicare Other | Source: Ambulatory Visit | Attending: Family Medicine | Admitting: Family Medicine

## 2022-02-05 ENCOUNTER — Ambulatory Visit
Admission: RE | Admit: 2022-02-05 | Discharge: 2022-02-05 | Disposition: A | Discharge status: Home / Self Care | Payer: Medicare Other | Source: Ambulatory Visit | Attending: Family Medicine | Admitting: Family Medicine

## 2022-02-05 DIAGNOSIS — E0789 Other specified disorders of thyroid: Secondary | ICD-10-CM | POA: Diagnosis not present

## 2022-02-05 DIAGNOSIS — D440 Neoplasm of uncertain behavior of thyroid gland: Secondary | ICD-10-CM | POA: Insufficient documentation

## 2022-02-05 DIAGNOSIS — E041 Nontoxic single thyroid nodule: Secondary | ICD-10-CM | POA: Diagnosis not present

## 2022-02-08 LAB — CYTOLOGY - NON PAP

## 2022-02-09 ENCOUNTER — Other Ambulatory Visit: Payer: Self-pay | Admitting: Family Medicine

## 2022-02-09 DIAGNOSIS — E041 Nontoxic single thyroid nodule: Secondary | ICD-10-CM

## 2022-02-12 ENCOUNTER — Other Ambulatory Visit: Payer: Self-pay | Admitting: Family Medicine

## 2022-02-12 DIAGNOSIS — I10 Essential (primary) hypertension: Secondary | ICD-10-CM

## 2022-02-26 ENCOUNTER — Encounter (HOSPITAL_COMMUNITY): Payer: Self-pay

## 2022-03-04 ENCOUNTER — Telehealth: Payer: Self-pay | Admitting: Family Medicine

## 2022-03-04 NOTE — Telephone Encounter (Signed)
Left message for patient to call back and schedule Medicare Annual Wellness Visit (AWV).   Please offer to do virtually or by telephone.  Left office number and my jabber [phone].  Last AWV:06/17/2020  Please schedule at anytime with Nurse Health Advisor.

## 2022-03-31 ENCOUNTER — Ambulatory Visit (INDEPENDENT_AMBULATORY_CARE_PROVIDER_SITE_OTHER): Payer: Medicare Other | Admitting: Dermatology

## 2022-03-31 DIAGNOSIS — L720 Epidermal cyst: Secondary | ICD-10-CM | POA: Diagnosis not present

## 2022-03-31 DIAGNOSIS — L821 Other seborrheic keratosis: Secondary | ICD-10-CM

## 2022-03-31 DIAGNOSIS — Z85828 Personal history of other malignant neoplasm of skin: Secondary | ICD-10-CM

## 2022-03-31 DIAGNOSIS — Z1283 Encounter for screening for malignant neoplasm of skin: Secondary | ICD-10-CM

## 2022-03-31 DIAGNOSIS — D2239 Melanocytic nevi of other parts of face: Secondary | ICD-10-CM | POA: Diagnosis not present

## 2022-03-31 DIAGNOSIS — D485 Neoplasm of uncertain behavior of skin: Secondary | ICD-10-CM

## 2022-03-31 DIAGNOSIS — D2230 Melanocytic nevi of unspecified part of face: Secondary | ICD-10-CM | POA: Diagnosis not present

## 2022-03-31 NOTE — Patient Instructions (Signed)

## 2022-04-06 DIAGNOSIS — E041 Nontoxic single thyroid nodule: Secondary | ICD-10-CM | POA: Diagnosis not present

## 2022-04-08 ENCOUNTER — Other Ambulatory Visit: Payer: Self-pay | Admitting: Otolaryngology

## 2022-04-08 DIAGNOSIS — E041 Nontoxic single thyroid nodule: Secondary | ICD-10-CM

## 2022-04-21 ENCOUNTER — Other Ambulatory Visit: Payer: Self-pay | Admitting: Family Medicine

## 2022-04-24 ENCOUNTER — Encounter: Payer: Self-pay | Admitting: Dermatology

## 2022-04-24 NOTE — Progress Notes (Signed)
   Follow-Up Visit   Subjective  David Nielsen is a 71 y.o. male who presents for the following: Annual Exam (Here for annual skin exam. No concerns. History of non mole skin cancer. ).  Examination Location:  Duration:  Quality:  Associated Signs/Symptoms: Modifying Factors:  Severity:  Timing: Context:   Objective  Well appearing patient in no apparent distress; mood and affect are within normal limits. Full body skin exam.  No atypical pigmented lesions (all checked with dermoscopy), no recurrent nonmelanoma skin cancer, 1 possible new nonmelanoma skin cancer nose will be biopsied  Dorsum of Nose 4 mm pearly papule, rule out BCC       Mid Back 5 mm flattopped textured brown papule, compatible dermoscopy    A full examination was performed including scalp, head, eyes, ears, nose, lips, neck, chest, axillae, abdomen, back, buttocks, bilateral upper extremities, bilateral lower extremities, hands, feet, fingers, toes, fingernails, and toenails. All findings within normal limits unless otherwise noted below.   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  Skin / nail biopsy Type of biopsy: tangential   Informed consent: discussed and consent obtained   Timeout: patient name, date of birth, surgical site, and procedure verified   Anesthesia: the lesion was anesthetized in a standard fashion   Anesthetic:  1% lidocaine w/ epinephrine 1-100,000 local infiltration Instrument used: flexible razor blade   Hemostasis achieved with: ferric subsulfate   Outcome: patient tolerated procedure well   Post-procedure details: wound care instructions given    Specimen 1 - Surgical pathology Differential Diagnosis: BCC  Check Margins: No  Screening for malignant neoplasm of skin  Yearly skin exam.  Seborrheic keratosis Mid Back  Leave if stable      I, Lavonna Monarch, MD, have reviewed all documentation for this visit.  The  documentation on 04/24/22 for the exam, diagnosis, procedures, and orders are all accurate and complete.

## 2022-05-04 IMAGING — US US SOFT TISSUE HEAD/NECK
1 series · 14 of 25 positions shown · non-contrast
Comparison: None.

CLINICAL DATA: 70-year-old male with soft tissue mass versus lymph
node in the high right neck near the hairline, discovered 1 to
years ago.

EXAM:
ULTRASOUND OF HEAD/NECK SOFT TISSUES
TECHNIQUE: Ultrasound examination of the head and neck soft tissues was
performed in the area of clinical concern.

[Series 1: us soft tissue head/neck · 14 of 40 slices shown]
[im 1/40]
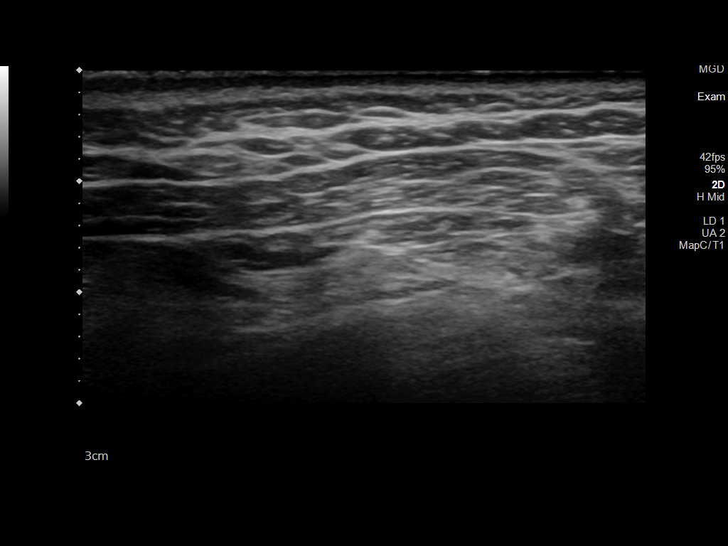
[im 4/40]
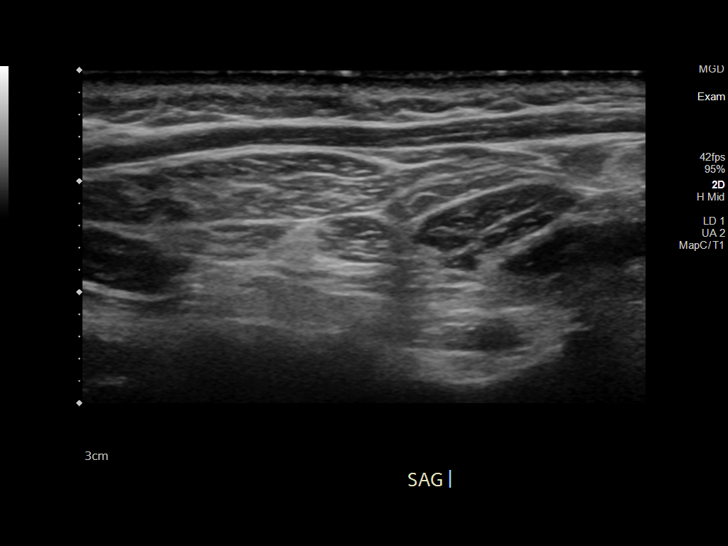
[im 7/40]
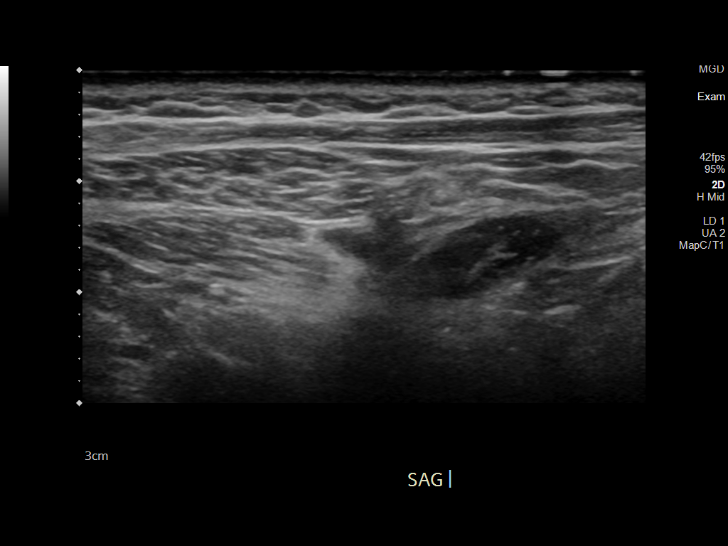
[im 10/40]
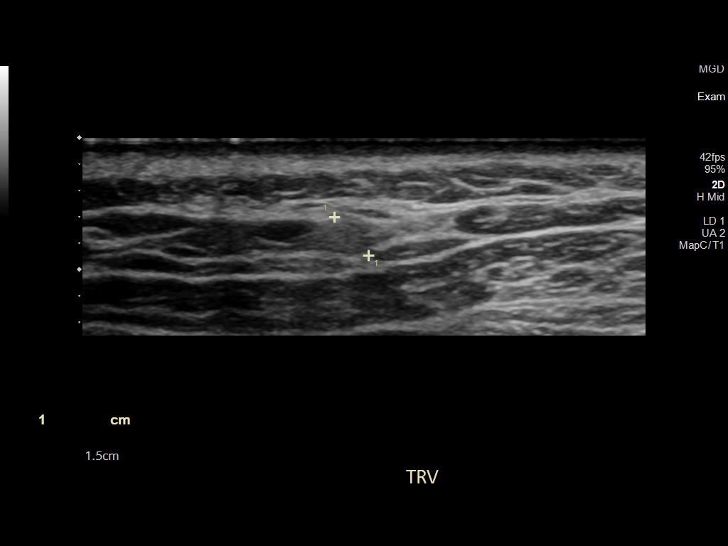
[im 14/40]
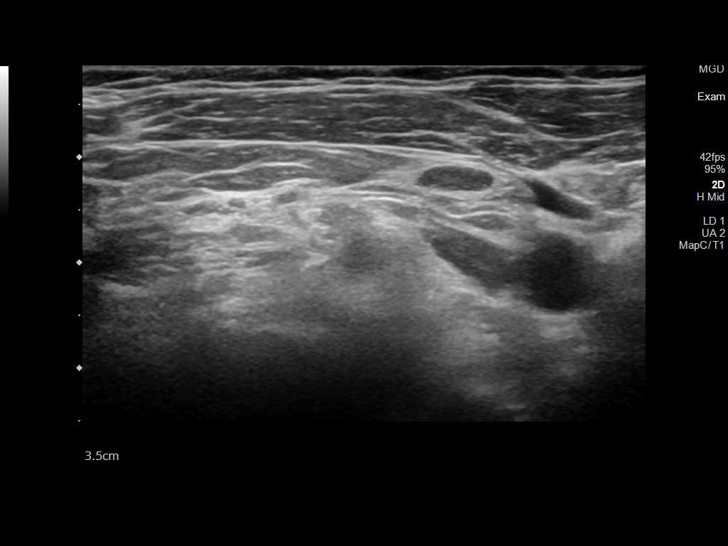
[im 15/40]
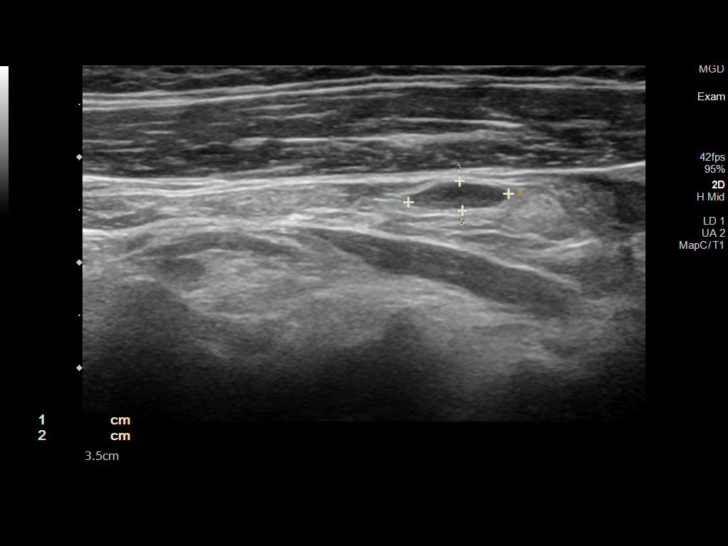
[im 18/40]
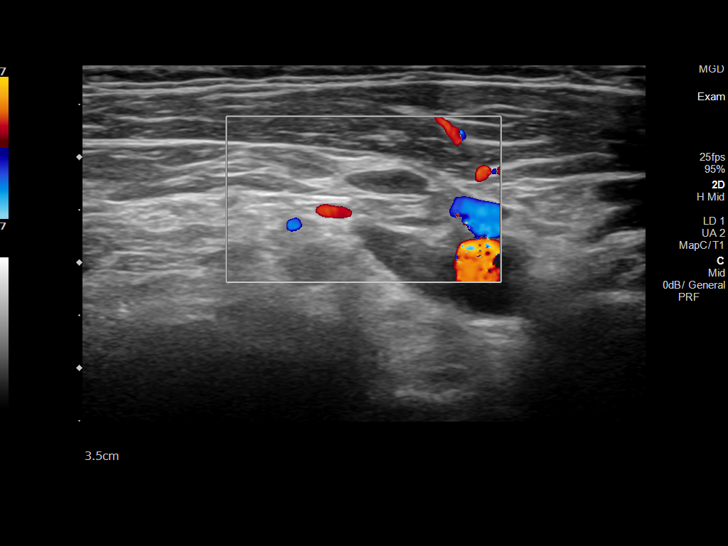
[im 22/40]
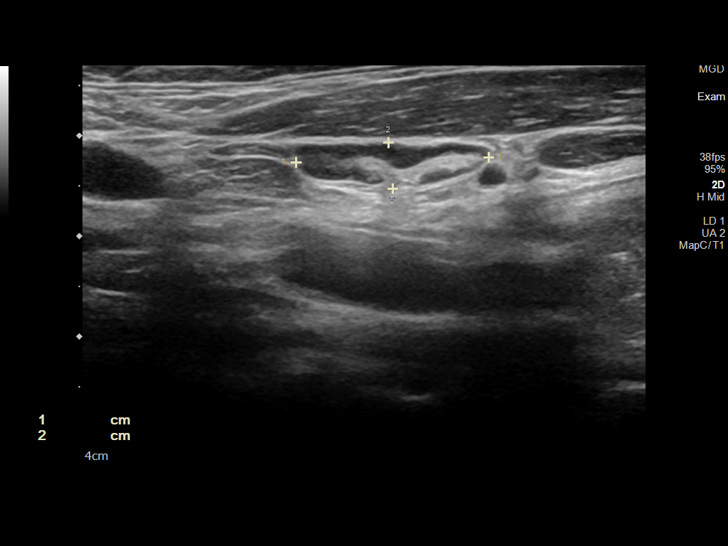
[im 25/40]
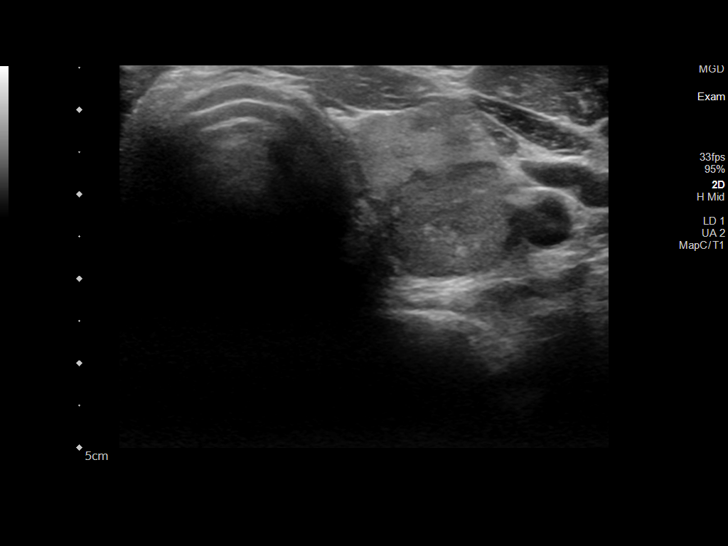
[im 27/40]
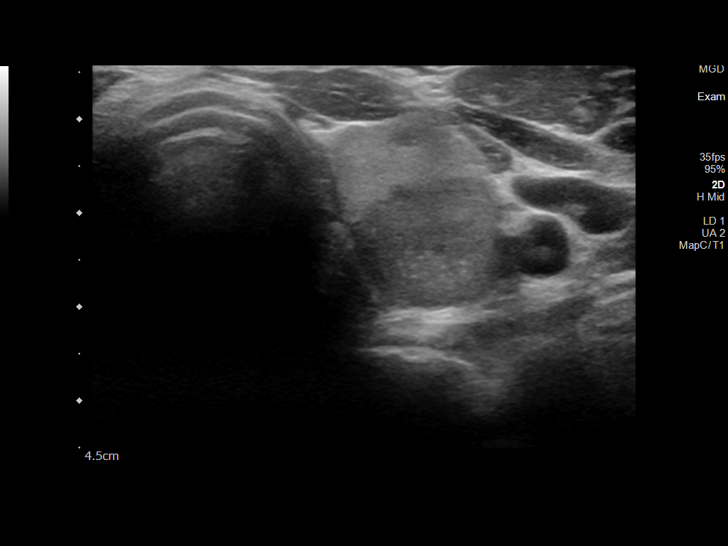
[im 30/40]
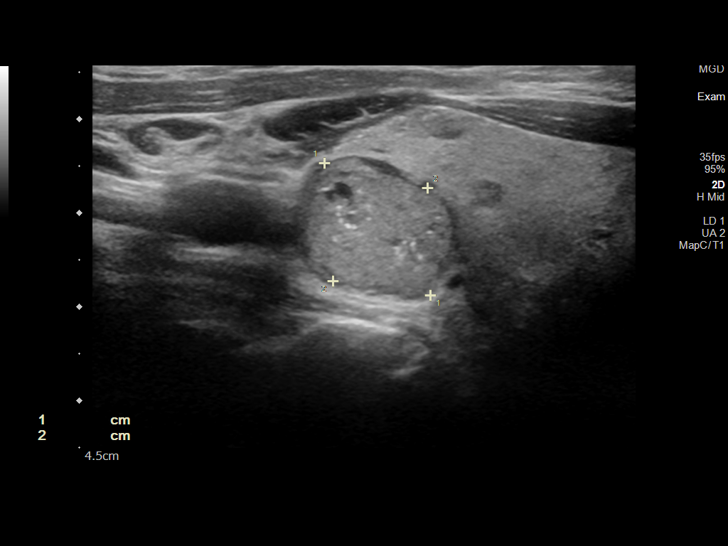
[im 33/40]
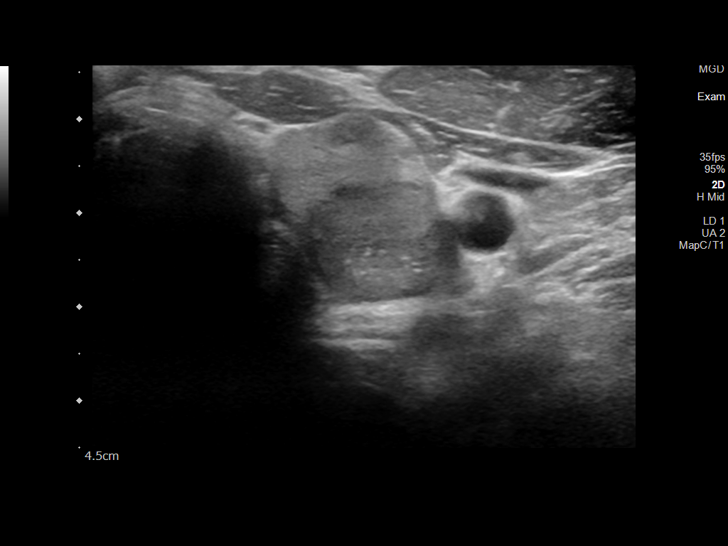
[im 36/40]
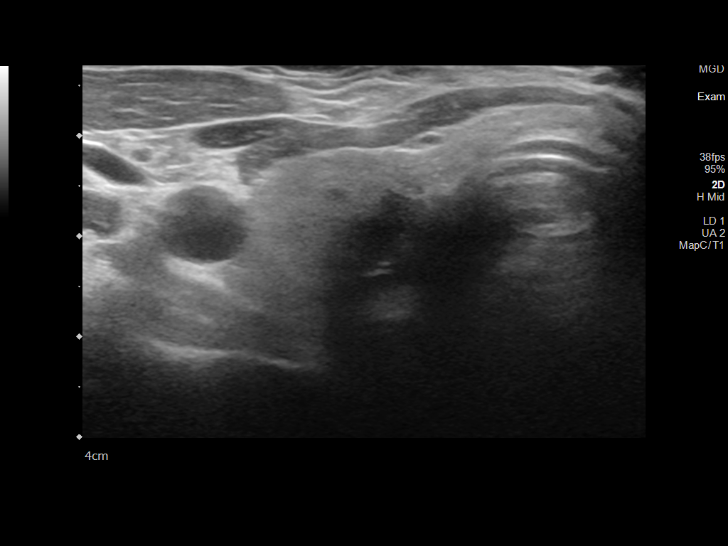
[im 40/40]
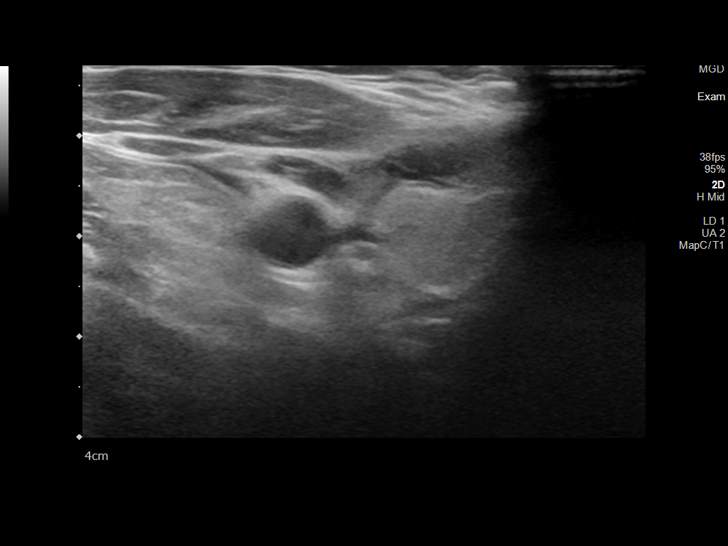

[14 of 25 positions shown; findings below may reference images not displayed]

FINDINGS: Grayscale and brief color Doppler imaging in the bilateral neck. In
the area of clinical concern only a small, physiologic level 5 lymph
node is identified (image 10) measuring 3-4 mm short axis with
normal fatty hilum. No hypervascularity on color Doppler.

Normal background fibromuscular architecture bilaterally. Additional
small 3-5 mm short axis lymph nodes are identified bilaterally, with
normal fatty hila. No neck mass or abnormality in these areas.

But there is an incidentally noted left thyroid lobe heterogeneous
nodule with vascularity measuring 1.8 x 1.4 x 1.5 cm (images 31-33).
IMPRESSION: 1. No cervical lymphadenopathy. Small physiologic right level 5
lymph node at the area of palpable concern.

2. Incidental 1.8 cm left thyroid nodule. Recommend dedicated
Thyroid US (ref: [HOSPITAL]. [DATE]): 143-50).

## 2022-05-08 IMAGING — US US THYROID
1 series · 13 of 25 positions shown · non-contrast
Comparison: 01/22/2022 ultrasound soft tissue neck

CLINICAL DATA: Thyroid nodule

EXAM:
THYROID ULTRASOUND
TECHNIQUE: Ultrasound examination of the thyroid gland and adjacent soft
tissues was performed.

[Series 1: us thyroid · 13 of 51 slices shown]
[im 1/51]
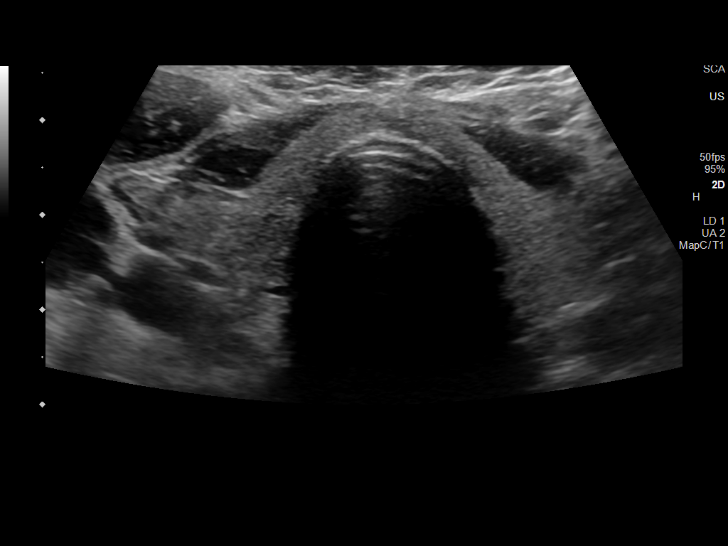
[im 5/51]
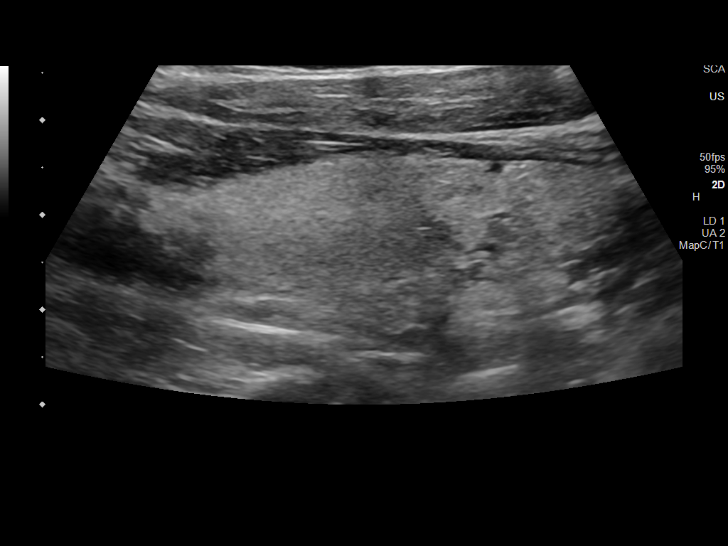
[im 9/51]
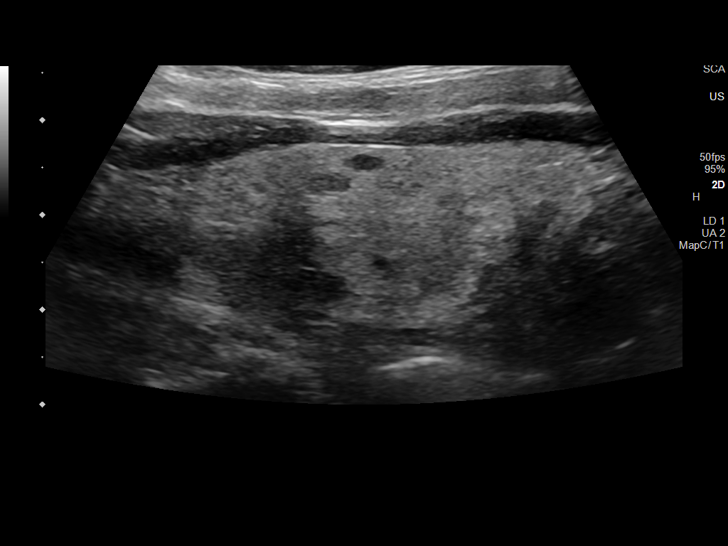
[im 13/51]
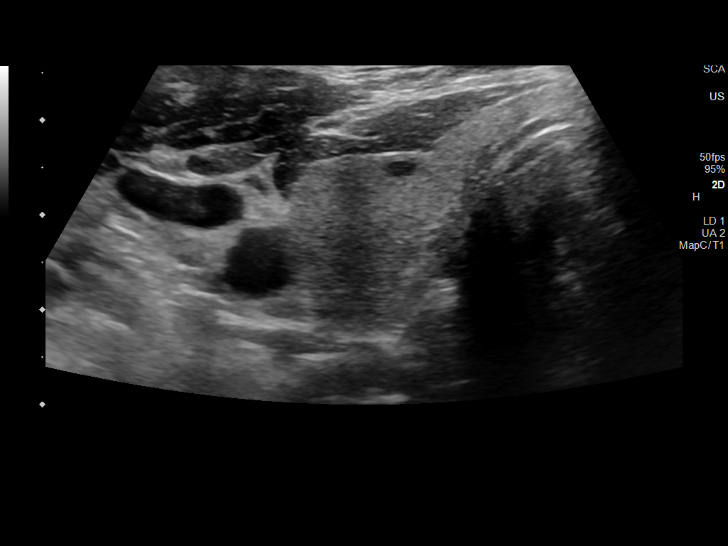
[im 17/51]
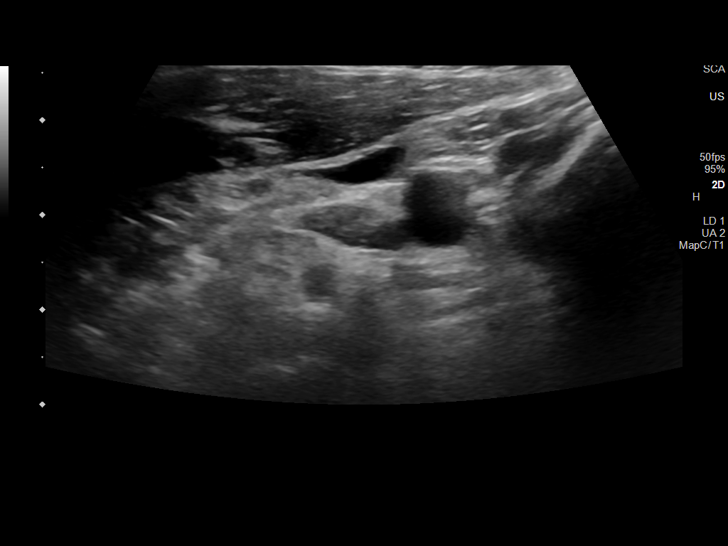
[im 21/51]
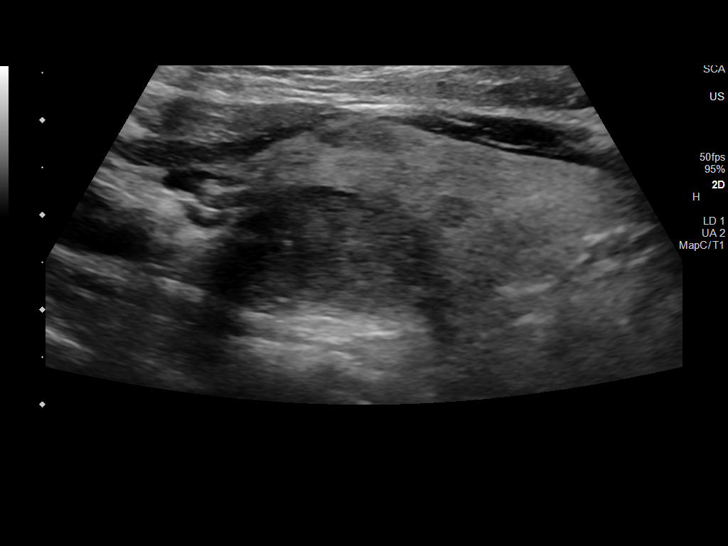
[im 26/51]
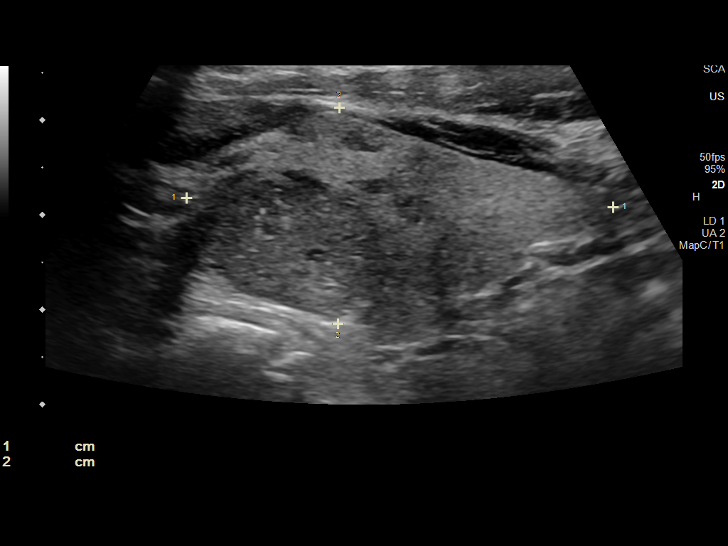
[im 30/51]
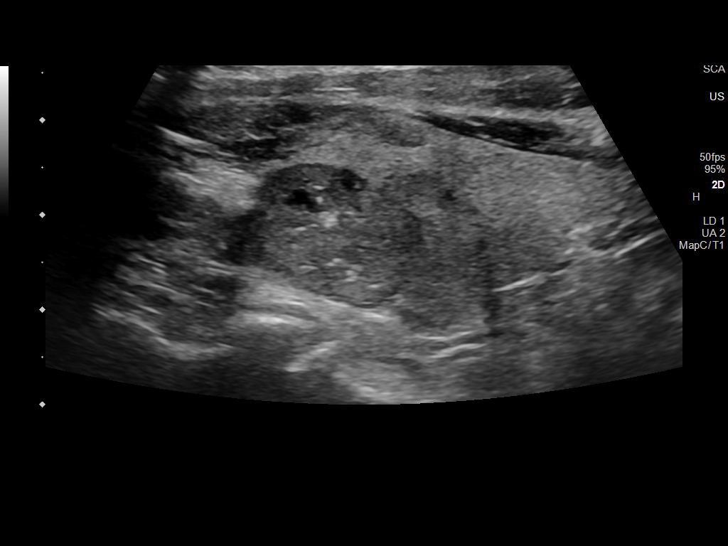
[im 34/51]
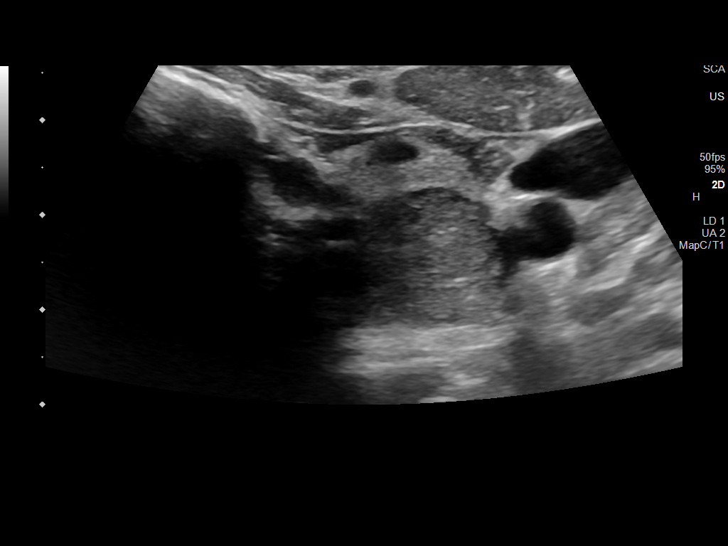
[im 38/51]
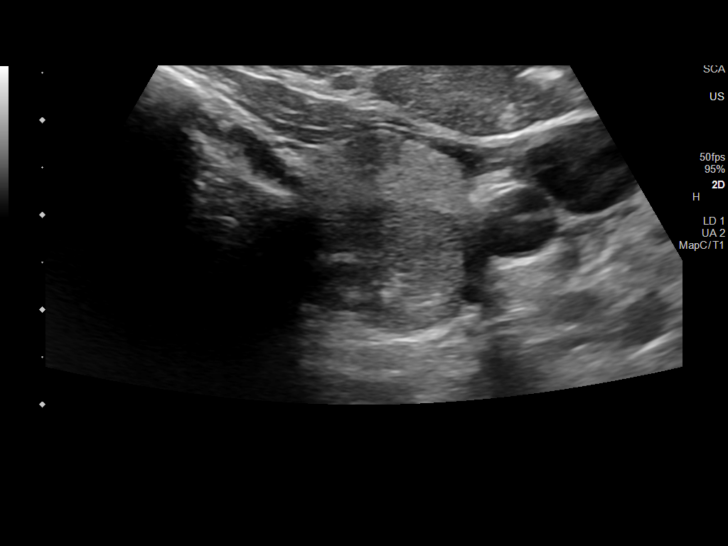
[im 42/51]
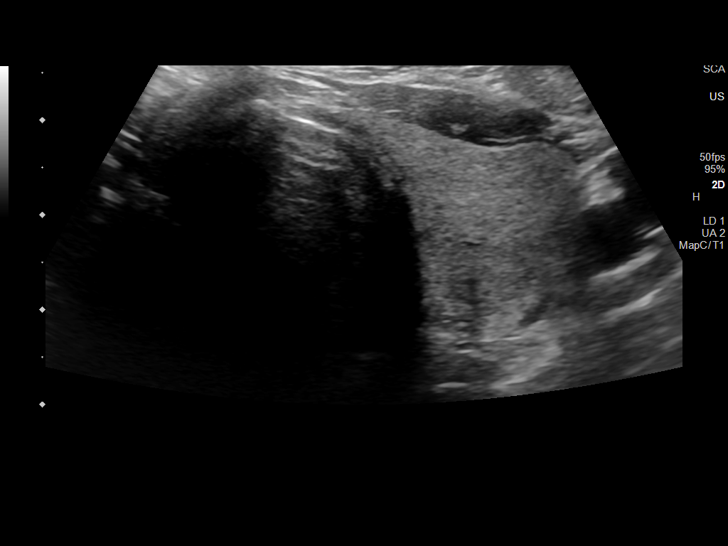
[im 46/51]
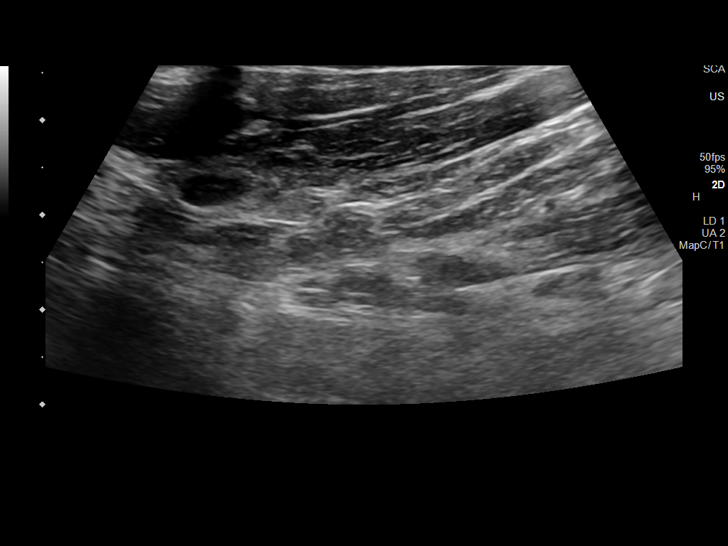
[im 51/51]
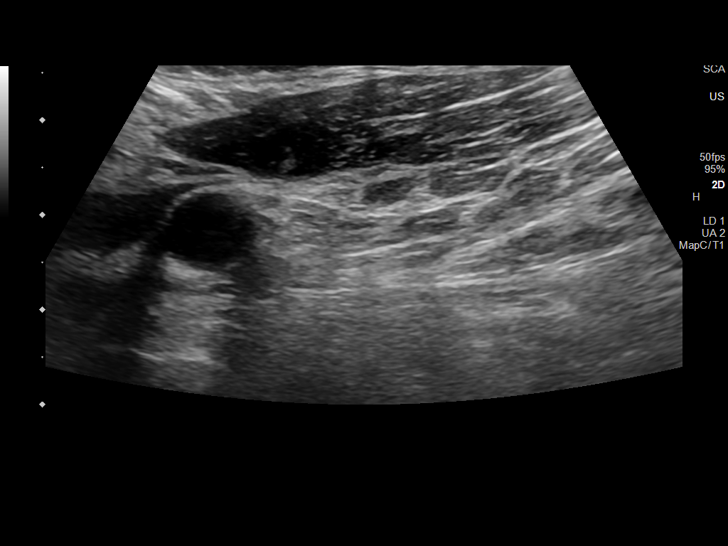

[13 of 25 positions shown; findings below may reference images not displayed]

FINDINGS: Parenchymal Echotexture: Moderately heterogenous

Isthmus: 3 mm

Right lobe: 4.5 x 1.9 x 1.7 cm

Left lobe: 4.5 x 2.3 x 2.0 cm

_________________________________________________________

Estimated total number of nodules >/= 1 cm: 1

Number of spongiform nodules >/=  2 cm not described below (TR1): 0

Number of mixed cystic and solid nodules >/= 1.5 cm not described
below (TR2): 0

_________________________________________________________

Nodule # 1:

Location: Left; Superior

Maximum size: 1.9 cm; Other 2 dimensions: 1.4 x 1.5 cm

Composition: solid/almost completely solid (2)

Echogenicity: hypoechoic (2)

Shape: not taller-than-wide (0)

Margins: ill-defined (0)

Echogenic foci: punctate echogenic foci (3)

ACR TI-RADS total points: 7.

ACR TI-RADS risk category: TR5 (>/= 7 points).

ACR TI-RADS recommendations:

**Given size (>/= 1.0 cm) and appearance, fine needle aspiration of
this highly suspicious nodule should be considered based on TI-RADS
criteria.

_________________________________________________________

There are a few scattered bilateral subcentimeter hypoechoic and
cystic nodules noted all measuring 5 mm or less. These would not
meet criteria for any biopsy or follow-up and are not fully
described by TI rads criteria. No hypervascularity. Normal regional
lymph nodes. No bulky adenopathy.
IMPRESSION: 1.9 cm left superior TR 5 nodule meets criteria for biopsy as above.
This correlates with the prior ultrasound finding.

The above is in keeping with the ACR TI-RADS recommendations - [HOSPITAL] 1731;[DATE].

## 2022-05-18 IMAGING — US US FNA BIOPSY THYROID 1ST LESION
1 series · 12 of 12 positions shown · non-contrast
Comparison: 01/26/2022 and previous

MEDICATIONS:
Lidocaine 1% subcutaneous

COMPLICATIONS:
None immediate.

INDICATION: Indeterminate thyroid nodule

EXAM:
ULTRASOUND GUIDED FINE NEEDLE ASPIRATION OF INDETERMINATE THYROID
NODULE
TECHNIQUE: Informed written consent was obtained from the patient after a
discussion of the risks, benefits and alternatives to treatment.
Questions regarding the procedure were encouraged and answered. A
timeout was performed prior to the initiation of the procedure.

[Series 1: us fna biopsy thyroid 1st lesion · 0.06mm/px · 12 acquisitions, 12 frames shown]
[im 1/12]
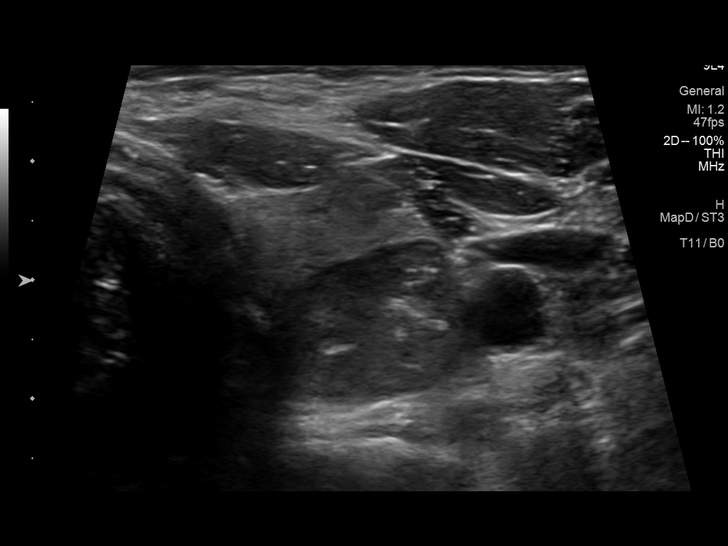
[im 2/12]
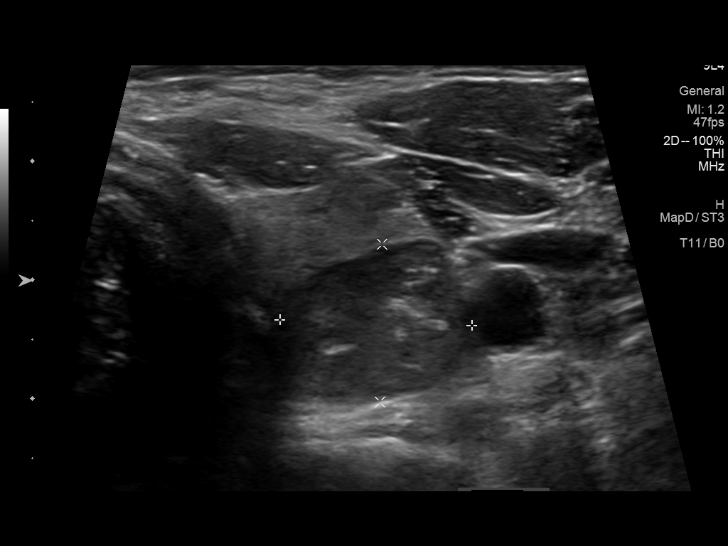
[im 3/12]
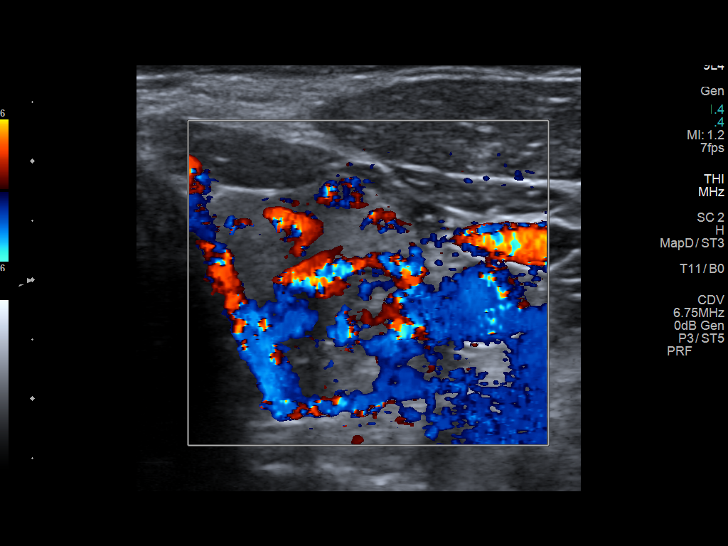
[im 4/12]
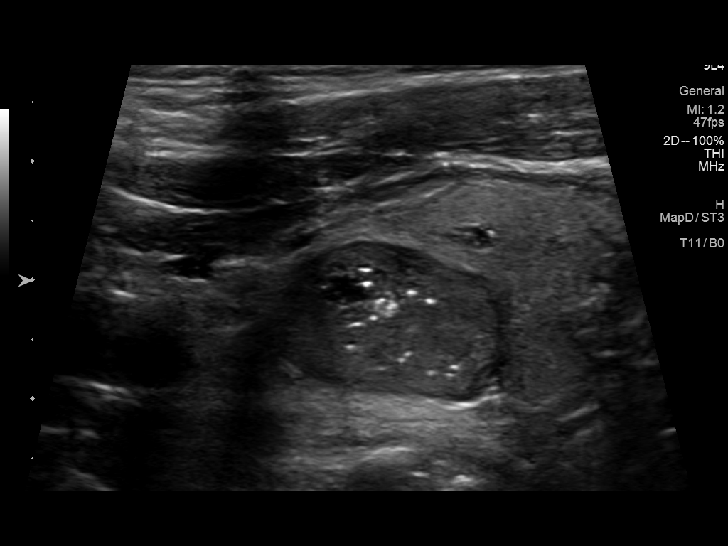
[im 5/12]
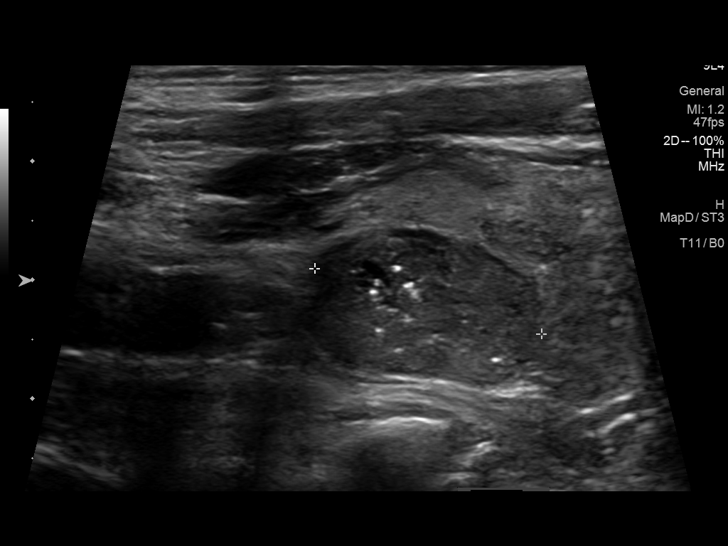
[im 6/12]
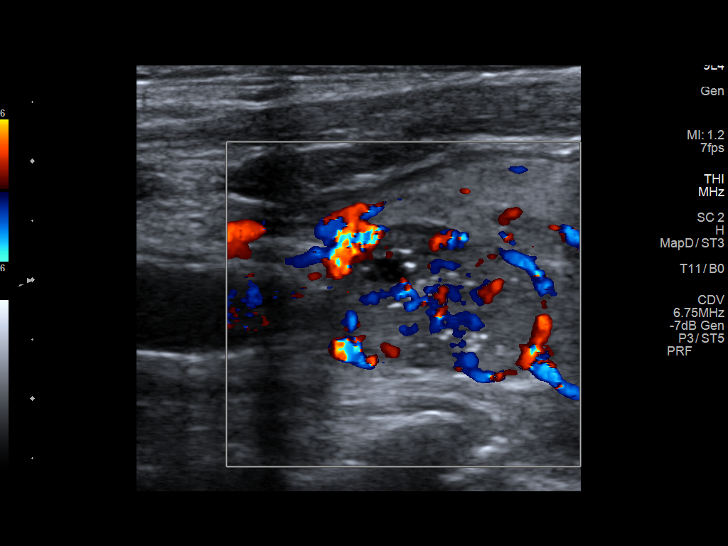
[im 7/12]
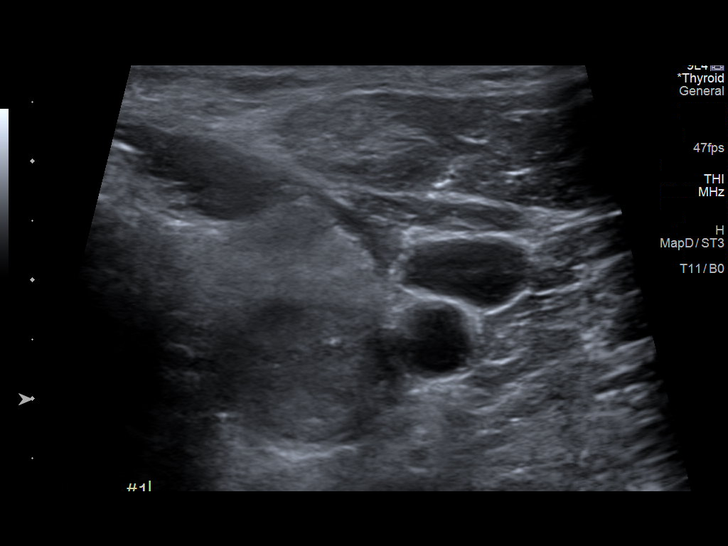
[im 8/12]
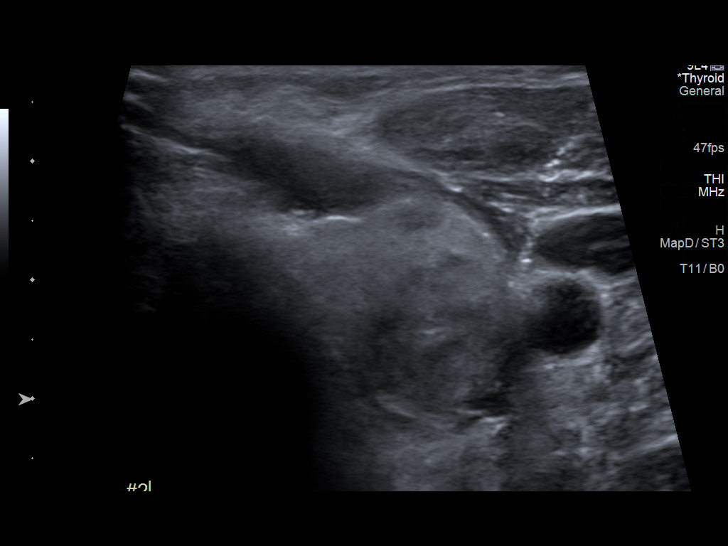
[im 9/12]
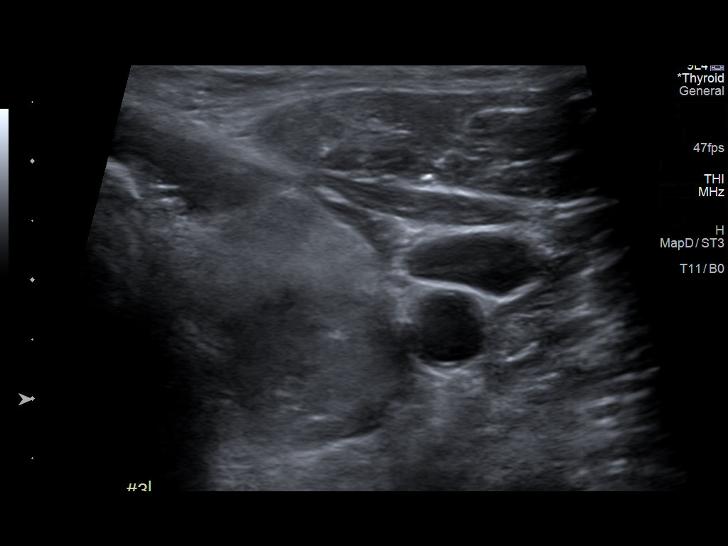
[im 10/12]
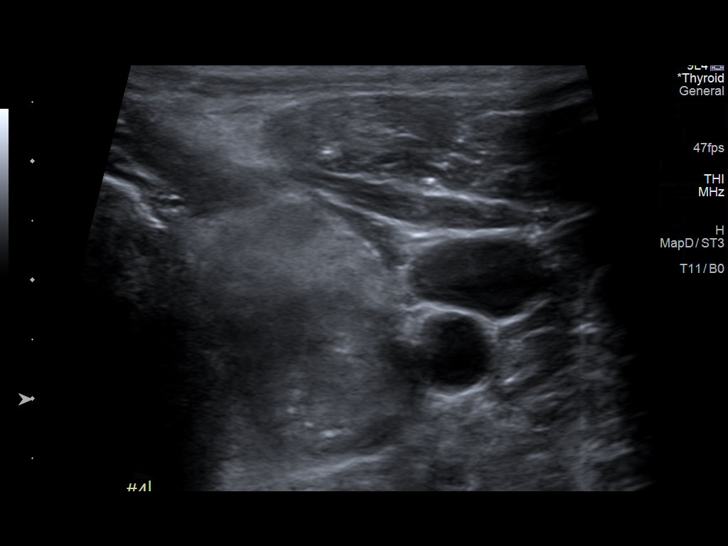
[im 11/12]
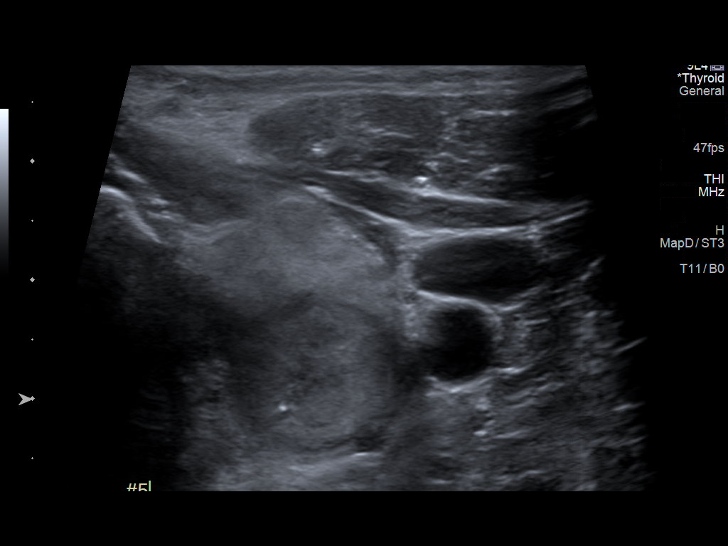
[im 12/12]
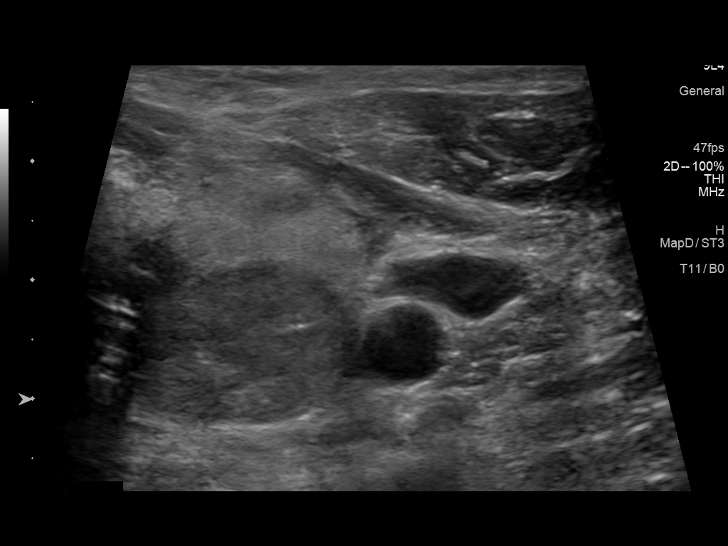

[12 of 12 positions shown; findings below may reference images not displayed]

Pre-procedural ultrasound scanning demonstrated unchanged size and
appearance of the indeterminate nodule within the superior left lobe

The procedure was planned. The neck was prepped in the usual sterile
fashion, and a sterile drape was applied covering the operative
field. A timeout was performed prior to the initiation of the
procedure. Local anesthesia was provided with 1% lidocaine.

Under direct ultrasound guidance, 5 FNA biopsies were performed of
the superior left nodule with a 25 gauge needle. Multiple ultrasound
images were saved for procedural documentation purposes. The samples
were prepared and submitted to pathology, and sequestered for
molecular studies as needed.

Limited post procedural scanning was negative for hematoma or
additional complication. Dressings were placed. The patient
tolerated the above procedures procedure well without immediate
postprocedural complication.
FINDINGS: Nodule reference number based on prior diagnostic ultrasound: 1

Maximum size: 1.9 cm

Location: Left; superior

ACR TI-RADS risk category: 5

Reason for biopsy: meets ACR TI-RADS criteria

Ultrasound imaging confirms appropriate placement of the needles
within the thyroid nodule.
IMPRESSION: Technically successful ultrasound guided fine needle aspiration of
superior left thyroid nodule.

## 2022-07-16 ENCOUNTER — Other Ambulatory Visit: Payer: Self-pay | Admitting: Family Medicine

## 2022-07-20 ENCOUNTER — Encounter: Payer: Self-pay | Admitting: Family Medicine

## 2022-07-20 ENCOUNTER — Other Ambulatory Visit: Payer: Self-pay | Admitting: Family Medicine

## 2022-07-21 ENCOUNTER — Other Ambulatory Visit: Payer: Self-pay | Admitting: Family Medicine

## 2022-07-21 DIAGNOSIS — E785 Hyperlipidemia, unspecified: Secondary | ICD-10-CM

## 2022-07-29 ENCOUNTER — Encounter: Payer: Self-pay | Admitting: Family Medicine

## 2022-07-29 ENCOUNTER — Other Ambulatory Visit (INDEPENDENT_AMBULATORY_CARE_PROVIDER_SITE_OTHER): Payer: Medicare Other

## 2022-07-29 ENCOUNTER — Ambulatory Visit (INDEPENDENT_AMBULATORY_CARE_PROVIDER_SITE_OTHER): Payer: Medicare Other

## 2022-07-29 DIAGNOSIS — E785 Hyperlipidemia, unspecified: Secondary | ICD-10-CM | POA: Diagnosis not present

## 2022-07-29 DIAGNOSIS — Z23 Encounter for immunization: Secondary | ICD-10-CM

## 2022-07-30 LAB — COMPREHENSIVE METABOLIC PANEL
ALT: 22 U/L (ref 0–53)
AST: 25 U/L (ref 0–37)
Albumin: 4.3 g/dL (ref 3.5–5.2)
Alkaline Phosphatase: 34 U/L — ABNORMAL LOW (ref 39–117)
BUN: 14 mg/dL (ref 6–23)
CO2: 29 mEq/L (ref 19–32)
Calcium: 10 mg/dL (ref 8.4–10.5)
Chloride: 102 mEq/L (ref 96–112)
Creatinine, Ser: 1.13 mg/dL (ref 0.40–1.50)
GFR: 65.03 mL/min (ref >60.00)
Glucose, Bld: 96 mg/dL (ref 70–99)
Potassium: 3.9 mEq/L (ref 3.5–5.1)
Sodium: 139 mEq/L (ref 135–145)
Total Bilirubin: 0.5 mg/dL (ref 0.2–1.2)
Total Protein: 6.7 g/dL (ref 6.0–8.3)

## 2022-07-30 LAB — LIPID PANEL
Cholesterol: 118 mg/dL (ref 0–200)
HDL: 45.3 mg/dL (ref >39.00)
LDL Cholesterol: 56 mg/dL (ref 0–99)
NonHDL: 72.9
Total CHOL/HDL Ratio: 3
Triglycerides: 83 mg/dL (ref 0.0–149.0)
VLDL: 16.6 mg/dL (ref 0.0–40.0)

## 2022-08-24 ENCOUNTER — Other Ambulatory Visit: Payer: Self-pay | Admitting: Family Medicine

## 2022-09-05 ENCOUNTER — Other Ambulatory Visit: Payer: Self-pay | Admitting: Family Medicine

## 2022-09-13 ENCOUNTER — Other Ambulatory Visit: Payer: Self-pay | Admitting: Family Medicine

## 2022-09-13 ENCOUNTER — Encounter: Payer: Self-pay | Admitting: Family Medicine

## 2022-09-21 ENCOUNTER — Ambulatory Visit (INDEPENDENT_AMBULATORY_CARE_PROVIDER_SITE_OTHER): Payer: Medicare Other | Admitting: Family Medicine

## 2022-09-21 ENCOUNTER — Encounter: Payer: Self-pay | Admitting: Family Medicine

## 2022-09-21 VITALS — BP 124/68 | HR 76 | Temp 98.6°F | Resp 18 | Ht 70.0 in | Wt 204.6 lb

## 2022-09-21 DIAGNOSIS — E785 Hyperlipidemia, unspecified: Secondary | ICD-10-CM

## 2022-09-21 DIAGNOSIS — I10 Essential (primary) hypertension: Secondary | ICD-10-CM | POA: Diagnosis not present

## 2022-09-21 MED ORDER — ATORVASTATIN CALCIUM 10 MG PO TABS: 10.0000 mg | ORAL_TABLET | Freq: Every day | ORAL | 1 refills | Status: DC

## 2022-09-21 MED ORDER — FENOFIBRATE 160 MG PO TABS: 160.0000 mg | ORAL_TABLET | Freq: Every day | ORAL | 1 refills | Status: DC

## 2022-09-21 MED ORDER — LOSARTAN POTASSIUM 50 MG PO TABS: ORAL_TABLET | ORAL | 3 refills | Status: DC

## 2022-09-21 NOTE — Assessment & Plan Note (Signed)
Well controlled, no changes to meds. Encouraged heart healthy diet such as the DASH diet and exercise as tolerated.  °

## 2022-09-21 NOTE — Patient Instructions (Signed)
Cholesterol Content in Foods ?Cholesterol is a waxy, fat-like substance that helps to carry fat in the blood. The body needs cholesterol in small amounts, but too much cholesterol can cause damage to the arteries and heart. ?What foods have cholesterol? ? ?Cholesterol is found in animal-based foods, such as meat, seafood, and dairy. Generally, low-fat dairy and lean meats have less cholesterol than full-fat dairy and fatty meats. The milligrams of cholesterol per serving (mg per serving) of common cholesterol-containing foods are listed below. ?Meats and other proteins ?Egg -- one large whole egg has 186 mg. ?Veal shank -- 4 oz (113 g) has 141 mg. ?Lean ground turkey (93% lean) -- 4 oz (113 g) has 118 mg. ?Fat-trimmed lamb loin -- 4 oz (113 g) has 106 mg. ?Lean ground beef (90% lean) -- 4 oz (113 g) has 100 mg. ?Lobster -- 3.5 oz (99 g) has 90 mg. ?Pork loin chops -- 4 oz (113 g) has 86 mg. ?Canned salmon -- 3.5 oz (99 g) has 83 mg. ?Fat-trimmed beef top loin -- 4 oz (113 g) has 78 mg. ?Frankfurter -- 1 frank (3.5 oz or 99 g) has 77 mg. ?Crab -- 3.5 oz (99 g) has 71 mg. ?Roasted chicken without skin, white meat -- 4 oz (113 g) has 66 mg. ?Light bologna -- 2 oz (57 g) has 45 mg. ?Deli-cut turkey -- 2 oz (57 g) has 31 mg. ?Canned tuna -- 3.5 oz (99 g) has 31 mg. ?Bacon -- 1 oz (28 g) has 29 mg. ?Oysters and mussels (raw) -- 3.5 oz (99 g) has 25 mg. ?Mackerel -- 1 oz (28 g) has 22 mg. ?Trout -- 1 oz (28 g) has 20 mg. ?Pork sausage -- 1 link (1 oz or 28 g) has 17 mg. ?Salmon -- 1 oz (28 g) has 16 mg. ?Tilapia -- 1 oz (28 g) has 14 mg. ?Dairy ?Soft-serve ice cream -- ? cup (4 oz or 86 g) has 103 mg. ?Whole-milk yogurt -- 1 cup (8 oz or 245 g) has 29 mg. ?Cheddar cheese -- 1 oz (28 g) has 28 mg. ?American cheese -- 1 oz (28 g) has 28 mg. ?Whole milk -- 1 cup (8 oz or 250 mL) has 23 mg. ?2% milk -- 1 cup (8 oz or 250 mL) has 18 mg. ?Cream cheese -- 1 tablespoon (Tbsp) (14.5 g) has 15 mg. ?Cottage cheese -- ? cup (4 oz or  113 g) has 14 mg. ?Low-fat (1%) milk -- 1 cup (8 oz or 250 mL) has 10 mg. ?Sour cream -- 1 Tbsp (12 g) has 8.5 mg. ?Low-fat yogurt -- 1 cup (8 oz or 245 g) has 8 mg. ?Nonfat Greek yogurt -- 1 cup (8 oz or 228 g) has 7 mg. ?Half-and-half cream -- 1 Tbsp (15 mL) has 5 mg. ?Fats and oils ?Cod liver oil -- 1 tablespoon (Tbsp) (13.6 g) has 82 mg. ?Butter -- 1 Tbsp (14 g) has 15 mg. ?Lard -- 1 Tbsp (12.8 g) has 14 mg. ?Bacon grease -- 1 Tbsp (12.9 g) has 14 mg. ?Mayonnaise -- 1 Tbsp (13.8 g) has 5-10 mg. ?Margarine -- 1 Tbsp (14 g) has 3-10 mg. ?The items listed above may not be a complete list of foods with cholesterol. Exact amounts of cholesterol in these foods may vary depending on specific ingredients and brands. Contact a dietitian for more information. ?What foods do not have cholesterol? ?Most plant-based foods do not have cholesterol unless you combine them with a food that has   cholesterol. Foods without cholesterol include: ?Grains and cereals. ?Vegetables. ?Fruits. ?Vegetable oils, such as olive, canola, and sunflower oil. ?Legumes, such as peas, beans, and lentils. ?Nuts and seeds. ?Egg whites. ?The items listed above may not be a complete list of foods that do not have cholesterol. Contact a dietitian for more information. ?Summary ?The body needs cholesterol in small amounts, but too much cholesterol can cause damage to the arteries and heart. ?Cholesterol is found in animal-based foods, such as meat, seafood, and dairy. Generally, low-fat dairy and lean meats have less cholesterol than full-fat dairy and fatty meats. ?This information is not intended to replace advice given to you by your health care provider. Make sure you discuss any questions you have with your health care provider. ?Document Revised: 02/13/2021 Document Reviewed: 02/13/2021 ?Elsevier Patient Education ? 2023 Elsevier Inc. ? ?

## 2022-09-21 NOTE — Assessment & Plan Note (Signed)
Encourage heart healthy diet such as MIND or DASH diet, increase exercise, avoid trans fats, simple carbohydrates and processed foods, consider a krill or fish or flaxseed oil cap daily.      Chemistry      Component Value Date/Time   NA 139 07/29/2022 1401   K 3.9 07/29/2022 1401   CL 102 07/29/2022 1401   CO2 29 07/29/2022 1401   BUN 14 07/29/2022 1401   CREATININE 1.13 07/29/2022 1401      Component Value Date/Time   CALCIUM 10.0 07/29/2022 1401   ALKPHOS 34 (L) 07/29/2022 1401   AST 25 07/29/2022 1401   ALT 22 07/29/2022 1401   BILITOT 0.5 07/29/2022 1401

## 2022-09-21 NOTE — Progress Notes (Signed)
Subjective:   By signing my name below, I, David Nielsen, attest that this documentation has been prepared under the direction and in the presence of Dr. Roma Schanz, DO. 09/21/2022    Patient ID: David Nielsen, male    DOB: Apr 18, 1951, 71 y.o.   MRN: [id number]  Chief Complaint  Patient presents with   Hypertension   Hyperlipidemia   Follow-up    Hypertension Pertinent negatives include no blurred vision, chest pain, headaches, malaise/fatigue, palpitations or shortness of breath.  Hyperlipidemia Pertinent negatives include no chest pain or shortness of breath.   Patient is in today for a follow up visit.   He reports having diarrhea yesterday. He continues taking Fiber in his diet and through supplements regularly. His diarrhea resolved by this morning.   He is requesting a refill for 10 mg Lipitor, 160 mg fenofibrate, 50 mg Losartan. He is UTD on flu vaccine this year. He is not interested in receiving the shingles vaccines at this time. He is not interested in receiving the pneumonia vaccine at this time.  He has no changes to his family medical history.  He is participating in regular exercise by working out at his home gym and while working at [employer]. He denies any leg swelling.    Past Medical History:  Diagnosis Date   Chicken pox    GERD (gastroesophageal reflux disease)    H/O gastroesophageal reflux (GERD)    HTN (hypertension)    Hyperlipidemia    PVC's (premature ventricular contractions)     Past Surgical History:  Procedure Laterality Date   COLONOSCOPY     none      Family History  Problem Relation Age of Onset   Alcohol abuse Father 91       pneumonia   Stroke Mother        Smoker   Cancer Sister        breast   Colon polyps Brother    Alcohol abuse Son    Stomach cancer Neg Hx    Rectal cancer Neg Hx    Esophageal cancer Neg Hx    Liver cancer Neg Hx    Colon cancer Neg Hx     Social History   Socioeconomic History   Marital  status: Married    Spouse name: Not on file   Number of children: 2   Years of education: Not on file   Highest education level: Not on file  Occupational History   Occupation: semi-retired    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: [employer]  Tobacco Use   Smoking status: Never   Smokeless tobacco: Never  Substance and Sexual Activity   Alcohol use: Yes    Alcohol/week: 0.0 standard drinks of alcohol    Comment: rare -- wine -- weekend   Drug use: No   Sexual activity: Yes    Partners: Female, Male  Other Topics Concern   Not on file  Social History Narrative   Exercise-- 3x a week on total gym and treadmill   Social Determinants of Health   Financial Resource Strain: Low Risk  (06/17/2020)   Overall Financial Resource Strain (CARDIA)    Difficulty of Paying Living Expenses: Not hard at all  Food Insecurity: No Food Insecurity (06/17/2020)   Hunger Vital Sign    Worried About Running Out of Food in the Last Year: Never true    [place] in the Last Year: Never true  Transportation Needs: No Transportation Needs (06/17/2020)   PRAPARE - Hydrologist (Medical): No    Lack of Transportation (Non-Medical): No  Physical Activity: Not on file  Stress: Not on file  Social Connections: Not on file  Intimate Partner Violence: Not on file    Outpatient Medications Prior to Visit  Medication Sig Dispense Refill   amLODipine (NORVASC) 5 MG tablet TAKE 1 TABLET BY MOUTH DAILY 90 tablet 3   aspirin EC 81 MG tablet Take 81 mg by mouth daily.     ibuprofen (ADVIL) 200 MG tablet      omeprazole (PRILOSEC) 20 MG capsule Take 20 mg by mouth daily.     atorvastatin (LIPITOR) 10 MG tablet Take 1 tablet (10 mg total) by mouth daily. 30 tablet 0   fenofibrate 160 MG tablet Take 1 tablet (160 mg total) by mouth daily. 30 tablet 0   losartan (COZAAR) 50 MG tablet TAKE 1 TABLET(50 MG) BY MOUTH DAILY 90 tablet 3    Facility-Administered Medications Prior to Visit  Medication Dose Route Frequency Provider Last Rate Last Admin   0.9 %  sodium chloride infusion  500 mL Intravenous Continuous Ladene Artist, MD        No Known Allergies  Review of Systems  Constitutional:  Negative for fever and malaise/fatigue.  HENT:  Negative for congestion.   Eyes:  Negative for blurred vision.  Respiratory:  Negative for shortness of breath.   Cardiovascular:  Negative for chest pain, palpitations and leg swelling.  Gastrointestinal:  Negative for abdominal pain, blood in stool and nausea.  Genitourinary:  Negative for dysuria and frequency.  Musculoskeletal:  Negative for falls.  Skin:  Negative for rash.  Neurological:  Negative for dizziness, loss of consciousness and headaches.  Endo/Heme/Allergies:  Negative for environmental allergies.  Psychiatric/Behavioral:  Negative for depression. The patient is not nervous/anxious.        Objective:    Physical Exam Vitals and nursing note reviewed.  Constitutional:      General: He is not in acute distress.    Appearance: Normal appearance. He is not ill-appearing.  HENT:     Head: Normocephalic and atraumatic.     Right Ear: External ear normal.     Left Ear: External ear normal.  Eyes:     Extraocular Movements: Extraocular movements intact.     Pupils: Pupils are equal, round, and reactive to light.  Cardiovascular:     Rate and Rhythm: Normal rate and regular rhythm.     Heart sounds: Normal heart sounds. No murmur heard.    No gallop.  Pulmonary:     Effort: Pulmonary effort is normal. No respiratory distress.     Breath sounds: Normal breath sounds. No wheezing or rales.  Skin:    General: Skin is warm and dry.  Neurological:     Mental Status: He is alert and oriented to person, place, and time.  Psychiatric:        Judgment: Judgment normal.     BP 124/68 (BP Location: Left Arm, Patient Position: Sitting, Cuff Size: Normal)    Pulse 76   Temp 98.6 F (37 C) (Oral)   Resp 18   Ht '5\' 10"'$  (1.778 m)   Wt 204 lb 9.6 oz (92.8 kg)   SpO2 97%   BMI 29.36 kg/m  Wt Readings from Last 3 Encounters:  09/21/22 204 lb 9.6 oz (92.8 kg)  01/21/22 206 lb 12.8 oz (93.8 kg)  10/29/21 206 lb 6.4 oz (93.6 kg)    Diabetic Foot Exam - Simple   No data filed    Lab Results  Component Value Date   WBC 4.5 01/21/2022   HGB 13.2 01/21/2022   HCT 38.9 01/21/2022   PLT 286 01/21/2022   GLUCOSE 96 07/29/2022   CHOL 118 07/29/2022   TRIG 83.0 07/29/2022   HDL 45.30 07/29/2022   LDLDIRECT 81.0 08/20/2016   LDLCALC 56 07/29/2022   ALT 22 07/29/2022   AST 25 07/29/2022   NA 139 07/29/2022   K 3.9 07/29/2022   CL 102 07/29/2022   CREATININE 1.13 07/29/2022   BUN 14 07/29/2022   CO2 29 07/29/2022   PSA 1.65 10/29/2021   HGBA1C 6.3 01/20/2021    No results found for: "TSH" Lab Results  Component Value Date   WBC 4.5 01/21/2022   HGB 13.2 01/21/2022   HCT 38.9 01/21/2022   MCV 86.8 01/21/2022   PLT 286 01/21/2022   Lab Results  Component Value Date   NA 139 07/29/2022   K 3.9 07/29/2022   CO2 29 07/29/2022   GLUCOSE 96 07/29/2022   BUN 14 07/29/2022   CREATININE 1.13 07/29/2022   BILITOT 0.5 07/29/2022   ALKPHOS 34 (L) 07/29/2022   AST 25 07/29/2022   ALT 22 07/29/2022   PROT 6.7 07/29/2022   ALBUMIN 4.3 07/29/2022   CALCIUM 10.0 07/29/2022   GFR 65.03 07/29/2022   Lab Results  Component Value Date   CHOL 118 07/29/2022   Lab Results  Component Value Date   HDL 45.30 07/29/2022   Lab Results  Component Value Date   LDLCALC 56 07/29/2022   Lab Results  Component Value Date   TRIG 83.0 07/29/2022   Lab Results  Component Value Date   CHOLHDL 3 07/29/2022   Lab Results  Component Value Date   HGBA1C 6.3 01/20/2021       Assessment & Plan:   Problem List Items Addressed This Visit       Unprioritized   Hyperlipidemia LDL goal <100 - Primary    Encourage heart healthy diet such as  MIND or DASH diet, increase exercise, avoid trans fats, simple carbohydrates and processed foods, consider a krill or fish or flaxseed oil cap daily.      Chemistry      Component Value Date/Time   NA 139 07/29/2022 1401   K 3.9 07/29/2022 1401   CL 102 07/29/2022 1401   CO2 29 07/29/2022 1401   BUN 14 07/29/2022 1401   CREATININE 1.13 07/29/2022 1401      Component Value Date/Time   CALCIUM 10.0 07/29/2022 1401   ALKPHOS 34 (L) 07/29/2022 1401   AST 25 07/29/2022 1401   ALT 22 07/29/2022 1401   BILITOT 0.5 07/29/2022 1401          Relevant Medications   atorvastatin (LIPITOR) 10 MG tablet   fenofibrate 160 MG tablet   losartan (COZAAR) 50 MG tablet   Essential hypertension    Well controlled, no changes to meds. Encouraged heart healthy diet such as the DASH diet and exercise as tolerated.        Relevant Medications   atorvastatin (LIPITOR) 10 MG tablet   fenofibrate 160 MG tablet   losartan (COZAAR) 50 MG tablet     Meds ordered this encounter  Medications   atorvastatin (LIPITOR) 10 MG tablet    Sig: Take 1 tablet (10 mg total) by mouth daily.    Dispense:  90 tablet    Refill:  1   fenofibrate 160 MG tablet    Sig: Take 1 tablet (160 mg total) by mouth daily.    Dispense:  90 tablet    Refill:  1   losartan (COZAAR) 50 MG tablet    Sig: TAKE 1 TABLET(50 MG) BY MOUTH DAILY    Dispense:  90 tablet    Refill:  3    I, Ann Held, DO, personally preformed the services described in this documentation.  All medical record entries made by the scribe were at my direction and in my presence.  I have reviewed the chart and discharge instructions (if applicable) and agree that the record reflects my personal performance and is accurate and complete. 09/21/2022   I,David Nielsen,acting as a scribe for Ann Held, DO.,have documented all relevant documentation on the behalf of Ann Held, DO,as directed by  Ann Held, DO while  in the presence of Ann Held, DO.   Ann Held, DO

## 2022-10-06 ENCOUNTER — Other Ambulatory Visit: Payer: Self-pay | Admitting: Family Medicine

## 2022-10-06 DIAGNOSIS — I10 Essential (primary) hypertension: Secondary | ICD-10-CM

## 2022-10-13 ENCOUNTER — Ambulatory Visit
Admission: RE | Admit: 2022-10-13 | Discharge: 2022-10-13 | Disposition: A | Discharge status: Home / Self Care | Payer: Medicare Other | Source: Ambulatory Visit | Attending: Otolaryngology | Admitting: Otolaryngology

## 2022-10-13 DIAGNOSIS — E041 Nontoxic single thyroid nodule: Secondary | ICD-10-CM | POA: Diagnosis not present

## 2023-01-03 ENCOUNTER — Encounter: Payer: Self-pay | Admitting: Family Medicine

## 2023-01-03 ENCOUNTER — Other Ambulatory Visit: Payer: Self-pay | Admitting: Family Medicine

## 2023-01-03 DIAGNOSIS — G629 Polyneuropathy, unspecified: Secondary | ICD-10-CM

## 2023-02-18 ENCOUNTER — Other Ambulatory Visit: Payer: Self-pay | Admitting: Family Medicine

## 2023-02-18 DIAGNOSIS — I10 Essential (primary) hypertension: Secondary | ICD-10-CM

## 2023-03-08 ENCOUNTER — Other Ambulatory Visit: Payer: Self-pay | Admitting: Family Medicine

## 2023-03-08 DIAGNOSIS — E785 Hyperlipidemia, unspecified: Secondary | ICD-10-CM

## 2023-03-08 DIAGNOSIS — I10 Essential (primary) hypertension: Secondary | ICD-10-CM

## 2023-04-08 ENCOUNTER — Other Ambulatory Visit: Payer: Self-pay | Admitting: Family Medicine

## 2023-04-08 DIAGNOSIS — I10 Essential (primary) hypertension: Secondary | ICD-10-CM

## 2023-04-08 DIAGNOSIS — E785 Hyperlipidemia, unspecified: Secondary | ICD-10-CM

## 2023-04-12 ENCOUNTER — Encounter: Payer: Self-pay | Admitting: Family Medicine

## 2023-04-12 ENCOUNTER — Ambulatory Visit (INDEPENDENT_AMBULATORY_CARE_PROVIDER_SITE_OTHER): Payer: Medicare Other | Admitting: Family Medicine

## 2023-04-12 VITALS — BP 120/70 | HR 80 | Temp 98.3°F | Resp 18 | Ht 70.0 in | Wt 199.6 lb

## 2023-04-12 DIAGNOSIS — E785 Hyperlipidemia, unspecified: Secondary | ICD-10-CM

## 2023-04-12 DIAGNOSIS — I10 Essential (primary) hypertension: Secondary | ICD-10-CM

## 2023-04-12 NOTE — Assessment & Plan Note (Signed)
Well controlled, no changes to meds. Encouraged heart healthy diet such as the DASH diet and exercise as tolerated.  °

## 2023-04-12 NOTE — Patient Instructions (Signed)

## 2023-04-12 NOTE — Assessment & Plan Note (Signed)
Encourage heart healthy diet such as MIND or DASH diet, increase exercise, avoid trans fats, simple carbohydrates and processed foods, consider a krill or fish or flaxseed oil cap daily.  °

## 2023-04-12 NOTE — Progress Notes (Signed)
Established Patient Office Visit  Subjective   Patient ID: David Nielsen, male    DOB: 1951-05-29  Age: 72 y.o. MRN: [id number]  Chief Complaint  Patient presents with   Hypertension   Hyperlipidemia   Follow-up    HPI Discussed the use of AI scribe software for clinical note transcription with the patient, who gave verbal consent to proceed.  History of Present Illness   The patient, with a history of hypertension managed with losartan, presents with difficulty sleeping. They wake up once a night to urinate and struggle to fall back asleep. They have tried melatonin with limited success, noting that it sometimes keeps them awake or makes them feel groggy the next day. They deny wanting a prescription sleep aid.  They also report a recent weight loss of 5 pounds, now weighing 199 pounds. They have stopped taking fiber supplements for bowel regularity, instead opting for half a cup of prune juice at lunch and dinner, which seems to be effective.  The patient also mentions a previous issue with their hip clicking when they walked, which has since resolved. They are unsure of the cause but are glad it's gone. They continue to exercise regularly, alternating between working out and using the treadmill.      Patient Active Problem List   Diagnosis Date Noted   Lymphadenopathy 01/21/2022   Nocturia 02/12/2018   Rectal bleeding 08/20/2016   Essential hypertension 11/06/2015   Hyperlipidemia LDL goal <100 11/06/2015   Peripheral neuropathy, idiopathic 11/06/2015   GERD (gastroesophageal reflux disease) 11/06/2015   Past Medical History:  Diagnosis Date   Chicken pox    GERD (gastroesophageal reflux disease)    H/O gastroesophageal reflux (GERD)    HTN (hypertension)    Hyperlipidemia    PVC's (premature ventricular contractions)    Past Surgical History:  Procedure Laterality Date   COLONOSCOPY     none     Social History   Tobacco Use   Smoking status: Never    Smokeless tobacco: Never  Substance Use Topics   Alcohol use: Yes    Alcohol/week: 0.0 standard drinks of alcohol    Comment: rare -- wine -- weekend   Drug use: No   Social History   Socioeconomic History   Marital status: Married    Spouse name: Not on file   Number of children: 2   Years of education: Not on file   Highest education level: Some college, no degree  Occupational History   Occupation: semi-retired    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[employer], 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[employer]  Tobacco Use   Smoking status: Never   Smokeless tobacco: Never  Substance and Sexual Activity   Alcohol use: Yes    Alcohol/week: 0.0 standard drinks of alcohol    Comment: rare -- wine -- weekend   Drug use: No   Sexual activity: Yes    Partners: Female, Male  Other Topics Concern   Not on file  Social History Narrative   Exercise-- 3x a week on total gym and treadmill   Social Determinants of Health   Financial Resource Strain: Low Risk  (04/10/2023)   Overall Financial Resource Strain (CARDIA)    Difficulty of Paying Living Expenses: Not hard at all  Food Insecurity: No Food Insecurity (04/10/2023)   Hunger Vital Sign    Worried About Running Out of Food in the Last Year: Never true    Ran Out of Food in the Last Year:  Never true  Transportation Needs: No Transportation Needs (04/10/2023)   PRAPARE - Administrator, Civil Service (Medical): No    Lack of Transportation (Non-Medical): No  Physical Activity: Sufficiently Active (04/10/2023)   Exercise Vital Sign    Days of Exercise per Week: 6 days    Minutes of Exercise per Session: 30 min  Stress: No Stress Concern Present (04/10/2023)   [employer] of Occupational Health - Occupational Stress Questionnaire    Feeling of Stress : Only a little  Social Connections: Unknown (04/10/2023)   Social Connection and Isolation Panel [NHANES]    Frequency of Communication with Friends and Family: Not on file     Frequency of Social Gatherings with Friends and Family: Never    Attends Religious Services: Never    Database administrator or Organizations: No    Attends Engineer, structural: Not on file    Marital Status: Married  Catering manager Violence: Not on file   Family Status  Relation Name Status   Father  Deceased   Brother  Deceased       drowned   Mother  Deceased at age 19   Sister  Alive   Brother  Alive   Sister  Alive   Sister  Alive   Sister  Alive   Son  (Not Specified)   Neg Hx  (Not Specified)   Family History  Problem Relation Age of Onset   Alcohol abuse Father 85       pneumonia   Stroke Mother        Smoker   Cancer Sister        breast   Colon polyps Brother    Alcohol abuse Son    Stomach cancer Neg Hx    Rectal cancer Neg Hx    Esophageal cancer Neg Hx    Liver cancer Neg Hx    Colon cancer Neg Hx    No Known Allergies    ROS    Objective:     BP 120/70 (BP Location: Left Arm, Patient Position: Sitting, Cuff Size: Normal)   Pulse 80   Temp 98.3 F (36.8 C) (Oral)   Resp 18   Ht 5\' 10"  (1.778 m)   Wt 199 lb 9.6 oz (90.5 kg)   SpO2 97%   BMI 28.64 kg/m  BP Readings from Last 3 Encounters:  04/12/23 120/70  09/21/22 124/68  01/21/22 112/60   Wt Readings from Last 3 Encounters:  04/12/23 199 lb 9.6 oz (90.5 kg)  09/21/22 204 lb 9.6 oz (92.8 kg)  01/21/22 206 lb 12.8 oz (93.8 kg)   SpO2 Readings from Last 3 Encounters:  04/12/23 97%  09/21/22 97%  01/21/22 98%      Physical Exam Vitals and nursing note reviewed.  Constitutional:      Appearance: He is well-developed.  HENT:     Head: Normocephalic and atraumatic.  Eyes:     Pupils: Pupils are equal, round, and reactive to light.  Neck:     Thyroid: No thyromegaly.  Cardiovascular:     Rate and Rhythm: Normal rate and regular rhythm.     Heart sounds: No murmur heard. Pulmonary:     Effort: Pulmonary effort is normal. No respiratory distress.     Breath sounds:  Normal breath sounds. No wheezing or rales.  Chest:     Chest wall: No tenderness.  Musculoskeletal:     Cervical back: Normal range of motion and neck  supple.     Right hip: No tenderness. Normal range of motion. Normal strength.     Left hip: No tenderness. Normal range of motion. Normal strength.     Right foot: No swelling or bony tenderness.     Left foot: No swelling or bony tenderness.  Skin:    General: Skin is warm and dry.  Neurological:     Mental Status: He is alert and oriented to person, place, and time.  Psychiatric:        Behavior: Behavior normal.        Thought Content: Thought content normal.        Judgment: Judgment normal.      No results found for any visits on 04/12/23.  Last CBC Lab Results  Component Value Date   WBC 4.5 01/21/2022   HGB 13.2 01/21/2022   HCT 38.9 01/21/2022   MCV 86.8 01/21/2022   MCH 29.5 01/21/2022   RDW 12.8 01/21/2022   PLT 286 01/21/2022   Last metabolic panel Lab Results  Component Value Date   GLUCOSE 96 07/29/2022   NA 139 07/29/2022   K 3.9 07/29/2022   CL 102 07/29/2022   CO2 29 07/29/2022   BUN 14 07/29/2022   CREATININE 1.13 07/29/2022   CALCIUM 10.0 07/29/2022   PROT 6.7 07/29/2022   ALBUMIN 4.3 07/29/2022   BILITOT 0.5 07/29/2022   ALKPHOS 34 (L) 07/29/2022   AST 25 07/29/2022   ALT 22 07/29/2022   Last lipids Lab Results  Component Value Date   CHOL 118 07/29/2022   HDL 45.30 07/29/2022   LDLCALC 56 07/29/2022   LDLDIRECT 81.0 08/20/2016   TRIG 83.0 07/29/2022   CHOLHDL 3 07/29/2022   Last hemoglobin A1c Lab Results  Component Value Date   HGBA1C 6.3 01/20/2021   Last thyroid functions No results found for: "TSH", "T3TOTAL", "T4TOTAL", "THYROIDAB" Last vitamin D No results found for: "25OHVITD2", "25OHVITD3", "VD25OH" Last vitamin B12 and Folate Lab Results  Component Value Date   VITAMINB12 281 01/20/2021      The ASCVD Risk score (Arnett DK, et al., 2019) failed to calculate for  the following reasons:   The valid total cholesterol range is 130 to 320 mg/dL    Assessment & Plan:   Problem List Items Addressed This Visit       Unprioritized   Hyperlipidemia LDL goal <100    Encourage heart healthy diet such as MIND or DASH diet, increase exercise, avoid trans fats, simple carbohydrates and processed foods, consider a krill or fish or flaxseed oil cap daily.        Relevant Orders   Comprehensive metabolic panel   Lipid panel   Essential hypertension - Primary    Well controlled, no changes to meds. Encouraged heart healthy diet such as the DASH diet and exercise as tolerated.        Relevant Orders   Comprehensive metabolic panel   Lipid panel    Return in about 6 months (around 10/12/2023), or if symptoms worsen or fail to improve, for annual exam, fasting.    Donato Schultz, DO

## 2023-04-13 LAB — LIPID PANEL
Cholesterol: 127 mg/dL (ref 0–200)
HDL: 45.6 mg/dL (ref >39.00)
LDL Cholesterol: 65 mg/dL (ref 0–99)
NonHDL: 81.46
Total CHOL/HDL Ratio: 3
Triglycerides: 82 mg/dL (ref 0.0–149.0)
VLDL: 16.4 mg/dL (ref 0.0–40.0)

## 2023-04-13 LAB — COMPREHENSIVE METABOLIC PANEL
ALT: 21 U/L (ref 0–53)
AST: 27 U/L (ref 0–37)
Albumin: 4.2 g/dL (ref 3.5–5.2)
Alkaline Phosphatase: 32 U/L — ABNORMAL LOW (ref 39–117)
BUN: 13 mg/dL (ref 6–23)
CO2: 28 mEq/L (ref 19–32)
Calcium: 10.1 mg/dL (ref 8.4–10.5)
Chloride: 102 mEq/L (ref 96–112)
Creatinine, Ser: 1.13 mg/dL (ref 0.40–1.50)
GFR: 64.71 mL/min (ref >60.00)
Glucose, Bld: 103 mg/dL — ABNORMAL HIGH (ref 70–99)
Potassium: 3.8 mEq/L (ref 3.5–5.1)
Sodium: 138 mEq/L (ref 135–145)
Total Bilirubin: 0.6 mg/dL (ref 0.2–1.2)
Total Protein: 6.6 g/dL (ref 6.0–8.3)

## 2023-06-09 ENCOUNTER — Other Ambulatory Visit: Payer: Self-pay | Admitting: Family Medicine

## 2023-06-09 ENCOUNTER — Encounter: Payer: Self-pay | Admitting: Family Medicine

## 2023-06-09 DIAGNOSIS — R051 Acute cough: Secondary | ICD-10-CM

## 2023-06-09 MED ORDER — PROMETHAZINE-DM 6.25-15 MG/5ML PO SYRP: 5.0000 mL | ORAL_SOLUTION | Freq: Four times a day (QID) | ORAL | 0 refills | Status: DC | PRN

## 2023-07-06 ENCOUNTER — Other Ambulatory Visit: Payer: Self-pay | Admitting: Family Medicine

## 2023-07-06 DIAGNOSIS — E785 Hyperlipidemia, unspecified: Secondary | ICD-10-CM

## 2023-07-06 DIAGNOSIS — I10 Essential (primary) hypertension: Secondary | ICD-10-CM

## 2023-07-08 ENCOUNTER — Encounter: Payer: Self-pay | Admitting: Family Medicine

## 2023-07-08 DIAGNOSIS — I10 Essential (primary) hypertension: Secondary | ICD-10-CM

## 2023-07-11 MED ORDER — AMLODIPINE BESYLATE 5 MG PO TABS: 5.0000 mg | ORAL_TABLET | Freq: Every day | ORAL | 1 refills | Status: DC

## 2023-08-25 ENCOUNTER — Ambulatory Visit (INDEPENDENT_AMBULATORY_CARE_PROVIDER_SITE_OTHER): Payer: Medicare Other | Admitting: Family Medicine

## 2023-08-25 ENCOUNTER — Telehealth (HOSPITAL_BASED_OUTPATIENT_CLINIC_OR_DEPARTMENT_OTHER): Payer: Self-pay

## 2023-08-25 ENCOUNTER — Encounter: Payer: Self-pay | Admitting: Family Medicine

## 2023-08-25 VITALS — BP 118/68 | HR 79 | Temp 98.3°F | Resp 18 | Ht 70.0 in | Wt 196.6 lb

## 2023-08-25 DIAGNOSIS — E041 Nontoxic single thyroid nodule: Secondary | ICD-10-CM

## 2023-08-25 DIAGNOSIS — I10 Essential (primary) hypertension: Secondary | ICD-10-CM | POA: Diagnosis not present

## 2023-08-25 DIAGNOSIS — E785 Hyperlipidemia, unspecified: Secondary | ICD-10-CM

## 2023-08-25 DIAGNOSIS — R351 Nocturia: Secondary | ICD-10-CM | POA: Diagnosis not present

## 2023-08-25 DIAGNOSIS — Z23 Encounter for immunization: Secondary | ICD-10-CM

## 2023-08-25 NOTE — Progress Notes (Signed)
Established Patient Office Visit  Subjective   Patient ID: David Nielsen, male    DOB: Jul 27, 1951  Age: 72 y.o. MRN: [id number]  Chief Complaint  Patient presents with   Hypertension   Hyperlipidemia   Follow-up    HPI Discussed the use of AI scribe software for clinical note transcription with the patient, who gave verbal consent to proceed.  History of Present Illness   The patient, with a history of hypertension, presents for a routine check-up. He reports no new health issues and denies any swelling, shortness of breath, or chest pains. He has been maintaining an active lifestyle, including part-time work and walking approximately four miles daily.  The patient's blood pressure is well-controlled on his current medication regimen, and he does not require any prescription refills at this time. He has been regularly attending eye examinations, with the next one due this year.  However, the patient reports increased nocturnal toe pain, which he attributes to neuropathy. The pain is described as affecting the top of the foot and toes, and is more pronounced when lying down. Over-the-counter Advil provides inconsistent relief, and the patient has expressed interest in a low-dose hydrocodone prescription for better pain management.  The patient also mentions a recent family history of sudden death due to presumed cardiovascular disease in a sibling who did not regularly see a doctor. He expresses a strong belief in the importance of regular medical check-ups, especially with advancing age.  Lastly, the patient is due for a follow-up regarding a thyroid issue, but no further details were provided in the conversation. He has also been considering getting a shingles vaccine and will discuss this with his spouse.      Patient Active Problem List   Diagnosis Date Noted   Lymphadenopathy 01/21/2022   Nocturia 02/12/2018   Rectal bleeding 08/20/2016   Essential hypertension 11/06/2015    Hyperlipidemia LDL goal <100 11/06/2015   Peripheral neuropathy, idiopathic 11/06/2015   GERD (gastroesophageal reflux disease) 11/06/2015   Past Medical History:  Diagnosis Date   Chicken pox    GERD (gastroesophageal reflux disease)    H/O gastroesophageal reflux (GERD)    HTN (hypertension)    Hyperlipidemia    PVC's (premature ventricular contractions)    Past Surgical History:  Procedure Laterality Date   COLONOSCOPY     none     Social History   Tobacco Use   Smoking status: Never   Smokeless tobacco: Never  Substance Use Topics   Alcohol use: Yes    Alcohol/week: 0.0 standard drinks of alcohol    Comment: rare -- wine -- weekend   Drug use: No   Social History   Socioeconomic History   Marital status: Married    Spouse name: Not on file   Number of children: 2   Years of education: Not on file   Highest education level: Some college, no degree  Occupational History   Occupation: semi-retired    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[employer], 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[employer]  Tobacco Use   Smoking status: Never   Smokeless tobacco: Never  Substance and Sexual Activity   Alcohol use: Yes    Alcohol/week: 0.0 standard drinks of alcohol    Comment: rare -- wine -- weekend   Drug use: No   Sexual activity: Yes    Partners: Female, Male  Other Topics Concern   Not on file  Social History Narrative   Exercise-- 3x a week on total gym  and treadmill   Social Determinants of Health   Financial Resource Strain: Low Risk  (04/10/2023)   Overall Financial Resource Strain (CARDIA)    Difficulty of Paying Living Expenses: Not hard at all  Food Insecurity: No Food Insecurity (04/10/2023)   Hunger Vital Sign    Worried About Running Out of Food in the Last Year: Never true    Ran Out of Food in the Last Year: Never true  Transportation Needs: No Transportation Needs (04/10/2023)   PRAPARE - Administrator, Civil Service (Medical): No    Lack of  Transportation (Non-Medical): No  Physical Activity: Sufficiently Active (04/10/2023)   Exercise Vital Sign    Days of Exercise per Week: 6 days    Minutes of Exercise per Session: 30 min  Stress: No Stress Concern Present (04/10/2023)   [employer] of Occupational Health - Occupational Stress Questionnaire    Feeling of Stress : Only a little  Social Connections: Unknown (04/10/2023)   Social Connection and Isolation Panel [NHANES]    Frequency of Communication with Friends and Family: Not on file    Frequency of Social Gatherings with Friends and Family: Never    Attends Religious Services: Never    Diplomatic Services operational officer: No    Attends Engineer, structural: Not on file    Marital Status: Married  Catering manager Violence: Not on file   Family Status  Relation Name Status   Mother  Deceased at age 78   Father  Deceased   Sister  Alive   Sister  Alive   Sister  Alive   Sister  Alive   Brother  Deceased       drowned   Mudlogger   Son  (Not Specified)   Neg Hx  (Not Specified)  No partnership data on file   Family History  Problem Relation Age of Onset   Stroke Mother        Smoker   Alcohol abuse Father 61       pneumonia   Cancer Sister        breast   Diabetes Brother    Colon polyps Brother    Alcohol abuse Son    Stomach cancer Neg Hx    Rectal cancer Neg Hx    Esophageal cancer Neg Hx    Liver cancer Neg Hx    Colon cancer Neg Hx    No Known Allergies    Review of Systems  Constitutional:  Negative for chills, fever and malaise/fatigue.  HENT:  Negative for congestion and hearing loss.   Eyes:  Negative for blurred vision and discharge.  Respiratory:  Negative for cough, sputum production and shortness of breath.   Cardiovascular:  Negative for chest pain, palpitations and leg swelling.  Gastrointestinal:  Negative for abdominal pain, blood in stool, constipation, diarrhea, heartburn, nausea and vomiting.   Genitourinary:  Negative for dysuria, frequency, hematuria and urgency.  Musculoskeletal:  Negative for back pain, falls and myalgias.  Skin:  Negative for rash.  Neurological:  Negative for dizziness, sensory change, loss of consciousness, weakness and headaches.  Endo/Heme/Allergies:  Negative for environmental allergies. Does not bruise/bleed easily.  Psychiatric/Behavioral:  Negative for depression and suicidal ideas. The patient is not nervous/anxious and does not have insomnia.       Objective:     BP 118/68 (BP Location: Left Arm, Patient Position: Sitting, Cuff Size: Normal)   Pulse 79   Temp 98.3  F (36.8 C) (Oral)   Resp 18   Ht 5\' 10"  (1.778 m)   Wt 196 lb 9.6 oz (89.2 kg)   SpO2 97%   BMI 28.21 kg/m  BP Readings from Last 3 Encounters:  08/25/23 118/68  04/12/23 120/70  09/21/22 124/68   Wt Readings from Last 3 Encounters:  08/25/23 196 lb 9.6 oz (89.2 kg)  04/12/23 199 lb 9.6 oz (90.5 kg)  09/21/22 204 lb 9.6 oz (92.8 kg)   SpO2 Readings from Last 3 Encounters:  08/25/23 97%  04/12/23 97%  09/21/22 97%      Physical Exam Vitals and nursing note reviewed.  Constitutional:      General: He is not in acute distress.    Appearance: Normal appearance. He is well-developed.  HENT:     Head: Normocephalic and atraumatic.     Right Ear: Tympanic membrane, ear canal and external ear normal. There is no impacted cerumen.     Left Ear: Tympanic membrane, ear canal and external ear normal. There is no impacted cerumen.     Nose: Nose normal.     Mouth/Throat:     Mouth: Mucous membranes are moist.     Pharynx: Oropharynx is clear. No oropharyngeal exudate or posterior oropharyngeal erythema.  Eyes:     General: No scleral icterus.       Right eye: No discharge.        Left eye: No discharge.     Conjunctiva/sclera: Conjunctivae normal.     Pupils: Pupils are equal, round, and reactive to light.  Neck:     Thyroid: No thyromegaly.     Vascular: No JVD.   Cardiovascular:     Rate and Rhythm: Normal rate and regular rhythm.     Heart sounds: Normal heart sounds. No murmur heard. Pulmonary:     Effort: Pulmonary effort is normal. No respiratory distress.     Breath sounds: Normal breath sounds.  Abdominal:     General: Bowel sounds are normal. There is no distension.     Palpations: Abdomen is soft. There is no mass.     Tenderness: There is no abdominal tenderness. There is no guarding or rebound.  Musculoskeletal:        General: Normal range of motion.     Cervical back: Normal range of motion and neck supple.     Right lower leg: No edema.     Left lower leg: No edema.  Lymphadenopathy:     Cervical: No cervical adenopathy.  Skin:    General: Skin is warm and dry.     Findings: No erythema or rash.  Neurological:     Mental Status: He is alert and oriented to person, place, and time.     Cranial Nerves: No cranial nerve deficit.     Motor: No abnormal muscle tone.     Deep Tendon Reflexes: Reflexes are normal and symmetric. Reflexes normal.  Psychiatric:        Mood and Affect: Mood normal.        Behavior: Behavior normal.        Thought Content: Thought content normal.        Judgment: Judgment normal.      No results found for any visits on 08/25/23.  Last CBC Lab Results  Component Value Date   WBC 4.5 01/21/2022   HGB 13.2 01/21/2022   HCT 38.9 01/21/2022   MCV 86.8 01/21/2022   MCH 29.5 01/21/2022   RDW 12.8 01/21/2022  PLT 286 01/21/2022   Last metabolic panel Lab Results  Component Value Date   GLUCOSE 103 (H) 04/12/2023   NA 138 04/12/2023   K 3.8 04/12/2023   CL 102 04/12/2023   CO2 28 04/12/2023   BUN 13 04/12/2023   CREATININE 1.13 04/12/2023   GFR 64.71 04/12/2023   CALCIUM 10.1 04/12/2023   PROT 6.6 04/12/2023   ALBUMIN 4.2 04/12/2023   BILITOT 0.6 04/12/2023   ALKPHOS 32 (L) 04/12/2023   AST 27 04/12/2023   ALT 21 04/12/2023   Last lipids Lab Results  Component Value Date   CHOL  127 04/12/2023   HDL 45.60 04/12/2023   LDLCALC 65 04/12/2023   LDLDIRECT 81.0 08/20/2016   TRIG 82.0 04/12/2023   CHOLHDL 3 04/12/2023   Last hemoglobin A1c Lab Results  Component Value Date   HGBA1C 6.3 01/20/2021   Last thyroid functions No results found for: "TSH", "T3TOTAL", "T4TOTAL", "THYROIDAB" Last vitamin D No results found for: "25OHVITD2", "25OHVITD3", "VD25OH" Last vitamin B12 and Folate Lab Results  Component Value Date   VITAMINB12 281 01/20/2021      The ASCVD Risk score (Arnett DK, et al., 2019) failed to calculate for the following reasons:   The valid total cholesterol range is 130 to 320 mg/dL    Assessment & Plan:   Problem List Items Addressed This Visit       Unprioritized   Nocturia   Relevant Orders   PSA   Hyperlipidemia LDL goal <100   Relevant Orders   Lipid panel   TSH   Comprehensive metabolic panel   CBC with Differential/Platelet   Essential hypertension - Primary   Relevant Orders   TSH   Comprehensive metabolic panel   CBC with Differential/Platelet   Other Visit Diagnoses     Need for influenza vaccination       Relevant Orders   Flu Vaccine Trivalent High Dose (Fluad)   Thyroid nodule       Relevant Orders   US THYROID     Assessment and Plan    Peripheral Neuropathy   Chronic neuropathy causes nocturnal toe pain in him, and his current management with Advil (600 mg) is insufficient. A previous trial of tramadol was ineffective, and he requests hydrocodone. We discussed the risks and benefits, including dependency and regulatory restrictions. We will consider a neurologist referral for further evaluation.  Hypertension   His blood pressure is well-controlled with no medication adjustments needed.  Thyroid Nodule   He is due for a follow-up, reporting no current symptoms. We will order a thyroid ultrasound.  General Health Maintenance   He is up-to-date with most health maintenance activities but is due for an  eye exam and dental check-up. We discussed the importance of regular screenings and vaccinations, including shingles and pneumonia vaccines. We will administer a flu shot, encourage the shingles vaccine, and recommend the pneumonia vaccine for those over 65. We also emphasized the importance of regular eye exams and dental visits.  Follow-up   We will follow up with an eye doctor for a regular exam and a dentist for a regular check-up. We will consider a dermatologist referral for a skin check and follow up with the primary care provider for thyroid ultrasound results.        No follow-ups on file.    Donato Schultz, DO

## 2023-08-25 NOTE — Patient Instructions (Signed)
Preventive Care 65 Years and Older, Male Preventive care refers to lifestyle choices and visits with your health care provider that can promote health and wellness. Preventive care visits are also called wellness exams. What can I expect for my preventive care visit? Counseling During your preventive care visit, your health care provider may ask about your: Medical history, including: Past medical problems. Family medical history. History of falls. Current health, including: Emotional well-being. Home life and relationship well-being. Sexual activity. Memory and ability to understand (cognition). Lifestyle, including: Alcohol, nicotine or tobacco, and drug use. Access to firearms. Diet, exercise, and sleep habits. Work and work environment. Sunscreen use. Safety issues such as seatbelt and bike helmet use. Physical exam Your health care provider will check your: Height and weight. These may be used to calculate your BMI (body mass index). BMI is a measurement that tells if you are at a healthy weight. Waist circumference. This measures the distance around your waistline. This measurement also tells if you are at a healthy weight and may help predict your risk of certain diseases, such as type 2 diabetes and high blood pressure. Heart rate and blood pressure. Body temperature. Skin for abnormal spots. What immunizations do I need?  Vaccines are usually given at various ages, according to a schedule. Your health care provider will recommend vaccines for you based on your age, medical history, and lifestyle or other factors, such as travel or where you work. What tests do I need? Screening Your health care provider may recommend screening tests for certain conditions. This may include: Lipid and cholesterol levels. Diabetes screening. This is done by checking your blood sugar (glucose) after you have not eaten for a while (fasting). Hepatitis C test. Hepatitis B test. HIV (human  immunodeficiency virus) test. STI (sexually transmitted infection) testing, if you are at risk. Lung cancer screening. Colorectal cancer screening. Prostate cancer screening. Abdominal aortic aneurysm (AAA) screening. You may need this if you are a current or former smoker. Talk with your health care provider about your test results, treatment options, and if necessary, the need for more tests. Follow these instructions at home: Eating and drinking  Eat a diet that includes fresh fruits and vegetables, whole grains, lean protein, and low-fat dairy products. Limit your intake of foods with high amounts of sugar, saturated fats, and salt. Take vitamin and mineral supplements as recommended by your health care provider. Do not drink alcohol if your health care provider tells you not to drink. If you drink alcohol: Limit how much you have to 0-2 drinks a day. Know how much alcohol is in your drink. In the [place] one drink equals one 12 oz bottle of beer (355 mL), one 5 oz glass of wine (148 mL), or one 1 oz glass of hard liquor (44 mL). Lifestyle Brush your teeth every morning and night with fluoride toothpaste. Floss one time each day. Exercise for at least 30 minutes 5 or more days each week. Do not use any products that contain nicotine or tobacco. These products include cigarettes, chewing tobacco, and vaping devices, such as e-cigarettes. If you need help quitting, ask your health care provider. Do not use drugs. If you are sexually active, practice safe sex. Use a condom or other form of protection to prevent STIs. Take aspirin only as told by your health care provider. Make sure that you understand how much to take and what form to take. Work with your health care provider to find out whether it is safe   and beneficial for you to take aspirin daily. Ask your health care provider if you need to take a cholesterol-lowering medicine (statin). Find healthy ways to manage stress, such  as: Meditation, yoga, or listening to music. Journaling. Talking to a trusted person. Spending time with friends and family. Safety Always wear your seat belt while driving or riding in a vehicle. Do not drive: If you have been drinking alcohol. Do not ride with someone who has been drinking. When you are tired or distracted. While texting. If you have been using any mind-altering substances or drugs. Wear a helmet and other protective equipment during sports activities. If you have firearms in your house, make sure you follow all gun safety procedures. Minimize exposure to UV radiation to reduce your risk of skin cancer. What's next? Visit your health care provider once a year for an annual wellness visit. Ask your health care provider how often you should have your eyes and teeth checked. Stay up to date on all vaccines. This information is not intended to replace advice given to you by your health care provider. Make sure you discuss any questions you have with your health care provider. Document Revised: 04/01/2021 Document Reviewed: 04/01/2021 Elsevier Patient Education  2024 Elsevier Inc.  

## 2023-08-26 LAB — CBC WITH DIFFERENTIAL/PLATELET
Basophils Absolute: 0 10*3/uL (ref 0.0–0.1)
Basophils Relative: 0.7 % (ref 0.0–3.0)
Eosinophils Absolute: 0.2 10*3/uL (ref 0.0–0.7)
Eosinophils Relative: 5 % (ref 0.0–5.0)
HCT: 39.8 % (ref 39.0–52.0)
Hemoglobin: 13.8 g/dL (ref 13.0–17.0)
Lymphocytes Relative: 24.2 % (ref 12.0–46.0)
Lymphs Abs: 1.1 10*3/uL (ref 0.7–4.0)
MCHC: 34.8 g/dL (ref 30.0–36.0)
MCV: 87.3 fL (ref 78.0–100.0)
Monocytes Absolute: 0.3 10*3/uL (ref 0.1–1.0)
Monocytes Relative: 7.8 % (ref 3.0–12.0)
Neutro Abs: 2.8 10*3/uL (ref 1.4–7.7)
Neutrophils Relative %: 62.3 % (ref 43.0–77.0)
Platelets: 275 10*3/uL (ref 150.0–400.0)
RBC: 4.55 Mil/uL (ref 4.22–5.81)
RDW: 13.2 % (ref 11.5–15.5)
WBC: 4.4 10*3/uL (ref 4.0–10.5)

## 2023-08-26 LAB — LIPID PANEL
Cholesterol: 137 mg/dL (ref 0–200)
HDL: 45.4 mg/dL (ref >39.00)
LDL Cholesterol: 70 mg/dL (ref 0–99)
NonHDL: 91.79
Total CHOL/HDL Ratio: 3
Triglycerides: 107 mg/dL (ref 0.0–149.0)
VLDL: 21.4 mg/dL (ref 0.0–40.0)

## 2023-08-26 LAB — COMPREHENSIVE METABOLIC PANEL
ALT: 17 U/L (ref 0–53)
AST: 20 U/L (ref 0–37)
Albumin: 4.4 g/dL (ref 3.5–5.2)
Alkaline Phosphatase: 37 U/L — ABNORMAL LOW (ref 39–117)
BUN: 18 mg/dL (ref 6–23)
CO2: 29 meq/L (ref 19–32)
Calcium: 10.2 mg/dL (ref 8.4–10.5)
Chloride: 103 meq/L (ref 96–112)
Creatinine, Ser: 1.07 mg/dL (ref 0.40–1.50)
GFR: 68.91 mL/min (ref >60.00)
Glucose, Bld: 103 mg/dL — ABNORMAL HIGH (ref 70–99)
Potassium: 3.9 meq/L (ref 3.5–5.1)
Sodium: 140 meq/L (ref 135–145)
Total Bilirubin: 0.5 mg/dL (ref 0.2–1.2)
Total Protein: 6.7 g/dL (ref 6.0–8.3)

## 2023-08-26 LAB — PSA: PSA: 2.28 ng/mL (ref 0.10–4.00)

## 2023-08-26 LAB — TSH: TSH: 2.16 u[IU]/mL (ref 0.35–5.50)

## 2023-10-04 ENCOUNTER — Other Ambulatory Visit: Payer: Self-pay | Admitting: Family Medicine

## 2023-10-04 DIAGNOSIS — E785 Hyperlipidemia, unspecified: Secondary | ICD-10-CM

## 2023-10-04 DIAGNOSIS — I10 Essential (primary) hypertension: Secondary | ICD-10-CM

## 2023-10-07 ENCOUNTER — Other Ambulatory Visit: Payer: Self-pay | Admitting: Family Medicine

## 2023-10-07 DIAGNOSIS — I10 Essential (primary) hypertension: Secondary | ICD-10-CM

## 2023-10-15 ENCOUNTER — Other Ambulatory Visit: Payer: Self-pay | Admitting: Family Medicine

## 2023-10-15 DIAGNOSIS — E785 Hyperlipidemia, unspecified: Secondary | ICD-10-CM

## 2023-10-15 DIAGNOSIS — I10 Essential (primary) hypertension: Secondary | ICD-10-CM

## 2023-11-15 ENCOUNTER — Encounter: Payer: Self-pay | Admitting: Family Medicine

## 2023-11-15 DIAGNOSIS — I10 Essential (primary) hypertension: Secondary | ICD-10-CM

## 2023-11-16 MED ORDER — LOSARTAN POTASSIUM 50 MG PO TABS: ORAL_TABLET | ORAL | 1 refills | Status: DC

## 2023-12-18 ENCOUNTER — Encounter: Payer: Self-pay | Admitting: Family Medicine

## 2023-12-18 DIAGNOSIS — I10 Essential (primary) hypertension: Secondary | ICD-10-CM

## 2023-12-18 DIAGNOSIS — E785 Hyperlipidemia, unspecified: Secondary | ICD-10-CM

## 2023-12-30 ENCOUNTER — Other Ambulatory Visit: Payer: Self-pay | Admitting: Family Medicine

## 2023-12-30 DIAGNOSIS — I10 Essential (primary) hypertension: Secondary | ICD-10-CM

## 2023-12-30 DIAGNOSIS — E785 Hyperlipidemia, unspecified: Secondary | ICD-10-CM

## 2024-01-03 MED ORDER — AMLODIPINE BESYLATE 5 MG PO TABS: 5.0000 mg | ORAL_TABLET | Freq: Every day | ORAL | 1 refills | Status: DC

## 2024-01-05 MED ORDER — FENOFIBRATE 160 MG PO TABS: 160.0000 mg | ORAL_TABLET | Freq: Every day | ORAL | 0 refills | Status: DC

## 2024-01-05 NOTE — Addendum Note (Signed)
 Addended by: Roxanne Gates on: 01/05/2024 04:50 PM   Modules accepted: Orders

## 2024-01-12 ENCOUNTER — Ambulatory Visit
Admission: RE | Admit: 2024-01-12 | Discharge: 2024-01-12 | Disposition: A | Discharge status: Home / Self Care | Source: Ambulatory Visit | Attending: Family Medicine | Admitting: Family Medicine

## 2024-01-12 DIAGNOSIS — E041 Nontoxic single thyroid nodule: Secondary | ICD-10-CM

## 2024-01-21 ENCOUNTER — Other Ambulatory Visit: Payer: Self-pay | Admitting: Family Medicine

## 2024-01-21 ENCOUNTER — Encounter: Payer: Self-pay | Admitting: Family Medicine

## 2024-01-21 DIAGNOSIS — E041 Nontoxic single thyroid nodule: Secondary | ICD-10-CM

## 2024-02-10 ENCOUNTER — Encounter (INDEPENDENT_AMBULATORY_CARE_PROVIDER_SITE_OTHER): Payer: Self-pay | Admitting: Otolaryngology

## 2024-03-05 ENCOUNTER — Ambulatory Visit: Admitting: Family Medicine

## 2024-03-05 ENCOUNTER — Ambulatory Visit: Payer: Self-pay

## 2024-03-05 VITALS — BP 122/72 | HR 82 | Ht 70.0 in | Wt 204.0 lb

## 2024-03-05 DIAGNOSIS — S86111A Strain of other muscle(s) and tendon(s) of posterior muscle group at lower leg level, right leg, initial encounter: Secondary | ICD-10-CM

## 2024-03-05 NOTE — Progress Notes (Signed)
 [facility] [address], [address] [place], [place] [address] [phone]  Date:  03/05/2024   Name:  David Nielsen   DOB:  Jun 22, 1951   MRN:  [id number]  PCP:  Estill Hemming, DO    Chief Complaint: Leg Pain   History of Present Illness:  David Nielsen is a 73 y.o. very pleasant male patient who presents with the following:  Pt seen today with concern of RIGHT calf pain Primary pt of Dr Crecencio Dodge- I have not seen him myself in the past  History of HTN, dyslipidemia  Labs done 6 months ago   About 3 weeks ago he pulled his right calf.  It seemed to be getting better.  However he pulled his right calf again about 5 days ago- he slipped while walking on some wet grass and had sudden pain in the medial aspect of the proximal right calf. He notes it is swollen, and there is some bruising settling around the ankle It does not seem weak He feels pain if he dorsiflexes the ankle or presses up on his toes no numbness of the foot  He is using advil-this does a decent job controlling pain  He is otherwise on heart  He is still able to walk and do his part-time job at [employer]   Patient Active Problem List   Diagnosis Date Noted   Lymphadenopathy 01/21/2022   Nocturia 02/12/2018   Rectal bleeding 08/20/2016   Essential hypertension 11/06/2015   Hyperlipidemia LDL goal <100 11/06/2015   Peripheral neuropathy, idiopathic 11/06/2015   GERD (gastroesophageal reflux disease) 11/06/2015    Past Medical History:  Diagnosis Date   Chicken pox    GERD (gastroesophageal reflux disease)    H/O gastroesophageal reflux (GERD)    HTN (hypertension)    Hyperlipidemia    PVC's (premature ventricular contractions)     Past Surgical History:  Procedure Laterality Date   COLONOSCOPY     none      Social History   Tobacco Use   Smoking status: Never   Smokeless tobacco: Never  Substance Use Topics   Alcohol use: Yes     Alcohol/week: 0.0 standard drinks of alcohol    Comment: rare -- wine -- weekend   Drug use: No    Family History  Problem Relation Age of Onset   Stroke Mother        Smoker   Alcohol abuse Father 84       pneumonia   Cancer Sister        breast   Diabetes Brother    Colon polyps Brother    Alcohol abuse Son    Stomach cancer Neg Hx    Rectal cancer Neg Hx    Esophageal cancer Neg Hx    Liver cancer Neg Hx    Colon cancer Neg Hx     No Known Allergies  Medication list has been reviewed and updated.  Current Outpatient Medications on File Prior to Visit  Medication Sig Dispense Refill   amLODipine  (NORVASC ) 5 MG tablet Take 1 tablet (5 mg total) by mouth daily. 90 tablet 1   aspirin EC 81 MG tablet Take 81 mg by mouth daily.     atorvastatin  (LIPITOR) 10 MG tablet TAKE 1 TABLET(10 MG) BY MOUTH DAILY 90 tablet 0   fenofibrate  160 MG tablet Take 1 tablet (160 mg total) by mouth daily. Pt needs office visit for further  refills. 90 tablet 0   ibuprofen (ADVIL) 200 MG tablet      losartan  (COZAAR ) 50 MG tablet TAKE 1 TABLET(50 MG) BY MOUTH DAILY 90 tablet 1   omeprazole (PRILOSEC) 20 MG capsule Take 20 mg by mouth daily.     No current facility-administered medications on file prior to visit.    Review of Systems:  As per HPI- otherwise negative.  Physical Examination: Vitals:   03/05/24 1304  BP: 122/72  Pulse: 82  SpO2: 96%   Vitals:   03/05/24 1304  Weight: 204 lb (92.5 kg)  Height: 5\' 10"  (1.778 m)   Body mass index is 29.27 kg/m. Ideal Body Weight: Weight in (lb) to have BMI = 25: 173.9  GEN: no acute distress.  Looks well, normal weight for age HEENT: Atraumatic, Normocephalic.  Ears and Nose: No external deformity. CV: RRR, No M/G/R. No JVD. No thrill. No extra heart sounds. PULM: CTA B, no wheezes, crackles, rhonchi. No retractions. No resp. distress. No accessory muscle use. EXTR: No c/c/e PSYCH: Normally interactive. Conversant.  Right knee  joint is stable.  Ankle, foot normal with strong pedal pulse.  He does have swelling of the right calf compared with the left as pictured below and there is also bruising around the ankle, likely where blood from a proximal calf muscle tear has settled.  He is tender around the medial head of the right gastroc muscle Normal strength of quad and hamstring, he is able to plantar and dorsiflex the right foot normally-however raising up onto his toes is painful on the right      Assessment and Plan: Gastrocnemius muscle strain, right, initial encounter - Plan: US  Venous Img Lower Unilateral Right  Patient seen today with a right calf muscle tear-he originally injured it 3 weeks ago and then reinjured about 5 days ago.  He is able to get around okay and pain is controlled, but he does have some swelling.  His wife is a vascular tech and was concerned with the possibility of a DVT, certainly we can get an ultrasound to rule this out.  Assuming ultrasound is negative I recommended relative rest, compression, elevation, NSAIDs.  Offered to have him do physical therapy or see orthopedics; he declines for now but will keep this in mind if he is not getting better  Signed Gates Kasal, MD

## 2024-03-05 NOTE — Patient Instructions (Signed)
 It was good to see you today- it looks like you have a tear of the calf muscle.  However, we will rule out a blood clot with an ultrasound of the right lower leg. Stop by imaging on the ground floor today to set this up  Assuming this is negative please try elevation, NSAIDs like aleve, and support with an OTC calf sleeve may help. Take it easy and increase your activity slowly as pain allows.  If you are not getting better formal PT and/ or orthopedic consultation may be helpful.  Please keep me posted!

## 2024-03-05 NOTE — Telephone Encounter (Signed)
 noted

## 2024-03-05 NOTE — Telephone Encounter (Addendum)
  Chief Complaint: R calf pain Symptoms: swelling, pain Frequency: x last week Pertinent Negatives: Patient denies fever, CP, SOB, redness Disposition: [] ED /[] Urgent Care (no appt availability in office) / [x] Appointment(In office/virtual)/ []  [facility]/ [] Home Care/ [] Refused Recommended Disposition /[] [facility]/ []  Follow-up with PCP Additional Notes: Pt c.o R calf pain/swelling since last Wednesday. Pt reports possibly pulled muscle in week prior then re-injuried this past Wednesday. Pt noticed pain and swelling. Denies any redness, SOB, CP. Pt endorses able to walk and currently at work. Scheduled patient per protocol on Mar 05, 2024 with alternate PCP. Patient verbalized understanding and to call back/911 with worsening symptoms.         Copied from CRM [id number]. Topic: Clinical - Red Word Triage >> Mar 05, 2024  8:02 AM Fonda T wrote: [facility] that prompted transfer to Nurse Triage: right calf pain, sore to touch, warm to touch Reason for Disposition  [1] Thigh, calf, or ankle swelling AND [2] only 1 side  Answer Assessment - Initial Assessment Questions 1. ONSET: "When did the pain start?"      Last Wednesday  2. LOCATION: "Where is the pain located?"      R calf pain 3. PAIN: "How bad is the pain?"    (Scale 1-10; or mild, moderate, severe)   -  MILD (1-3): doesn't interfere with normal activities    -  MODERATE (4-7): interferes with normal activities (e.g., work or school) or awakens from sleep, limping    -  SEVERE (8-10): excruciating pain, unable to do any normal activities, unable to walk     Mild - pain with stretching and squeezing calf 4. WORK OR EXERCISE: "Has there been any recent work or exercise that involved this part of the body?"      "Pulled it once last week...and then once again last Wednesday" 5. CAUSE: "What do you think is causing the leg pain?"     Unsure 6. OTHER SYMPTOMS: "Do you have any other symptoms?" (e.g., chest  pain, back pain, breathing difficulty, swelling, rash, fever, numbness, weakness)     Redness noticed on bilateral heels, and R calf swelling Denies SOB, CP, redness, numbness (above baseline - hx of neuropathy), weakness 7. PREGNANCY: "Is there any chance you are pregnant?" "When was your last menstrual period?"     N/a  Protocols used: Leg Pain-A-AH

## 2024-03-06 ENCOUNTER — Telehealth: Payer: Self-pay | Admitting: Neurology

## 2024-03-06 ENCOUNTER — Other Ambulatory Visit: Payer: Self-pay | Admitting: Family Medicine

## 2024-03-06 DIAGNOSIS — M79661 Pain in right lower leg: Secondary | ICD-10-CM

## 2024-03-06 NOTE — Telephone Encounter (Signed)
 Copied from CRM [id number]. Topic: General - Other >> Mar 06, 2024 11:48 AM [place]ique E wrote: Reason for CRM: Patient was in yesterday 5/19 in regards to his right calf, but the imaging order for his Ultrasound for tomorrow 5/21 states it is bilateral left and right. Patient questioning if this could get changed just for his right leg.

## 2024-03-07 ENCOUNTER — Encounter: Payer: Self-pay | Admitting: Family Medicine

## 2024-03-07 ENCOUNTER — Ambulatory Visit (HOSPITAL_BASED_OUTPATIENT_CLINIC_OR_DEPARTMENT_OTHER)
Admission: RE | Admit: 2024-03-07 | Discharge: 2024-03-07 | Disposition: A | Discharge status: Home / Self Care | Source: Ambulatory Visit | Attending: Family Medicine | Admitting: Family Medicine

## 2024-03-07 DIAGNOSIS — S86111A Strain of other muscle(s) and tendon(s) of posterior muscle group at lower leg level, right leg, initial encounter: Secondary | ICD-10-CM | POA: Insufficient documentation

## 2024-03-13 ENCOUNTER — Encounter (INDEPENDENT_AMBULATORY_CARE_PROVIDER_SITE_OTHER): Payer: Self-pay | Admitting: Otolaryngology

## 2024-03-13 ENCOUNTER — Ambulatory Visit (INDEPENDENT_AMBULATORY_CARE_PROVIDER_SITE_OTHER): Admitting: Otolaryngology

## 2024-03-13 VITALS — BP 147/76 | HR 73 | Ht 70.0 in | Wt 195.0 lb

## 2024-03-13 DIAGNOSIS — E041 Nontoxic single thyroid nodule: Secondary | ICD-10-CM

## 2024-03-13 NOTE — Progress Notes (Signed)
 Dear Dr. Crecencio Dodge, Here is my assessment for our mutual patient, David Nielsen. Thank you for allowing me the opportunity to care for your patient. Please do not hesitate to contact me should you have any other questions. Sincerely, Dr. Milon Aloe  Otolaryngology Clinic Note Referring provider: Dr. Crecencio Dodge HPI:  David Nielsen is a 73 y.o. male kindly referred by Dr. Crecencio Dodge for evaluation of thyroid  nodule.  Initial visit (02/2024): Noted incidentally two years ago for neck pain. Saw Dr. 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 Lost to f/u and then had recent US  again, seen for follow up.  Cannot feel nodule, no hyper or hypothyroid symptoms. No dysphagia, voice changes, neck masses.  Tobacco: no  Prior evaluation has included: labs, US , FNA (2023)  History of radiation to H&N: no Family history of thyroid  cancer: no   H&N Surgery: no Personal or FHx of bleeding dz or anesthesia difficulty: no  GLP-1: no AP/AC: ASA 81  PMHx: HTN, HLD  Independent Review of Additional Tests or Records:  Dr. Crecencio Dodge (08/25/2023): due for thyroid  follow up; Dx: thyroid  nodule; Rx: TSH, US  thyroid  Dr. 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 01/2022:   Thyroid  US  01/12/2024 and 10/13/2022 indepedently reviewed and interpreted: noted left mid-gland nodule 1.9x1.7 cm; hypoechoic, solid, appears to have well defined borders; microcal present PMH/Meds/All/SocHx/FamHx/ROS:   Past Medical History:  Diagnosis Date   Chicken pox    GERD (gastroesophageal reflux disease)    H/O gastroesophageal reflux (GERD)    HTN (hypertension)    Hyperlipidemia    PVC's (premature ventricular contractions)      Past Surgical History:  Procedure Laterality  Date   COLONOSCOPY     none      Family History  Problem Relation Age of Onset   Stroke Mother        Smoker   Alcohol abuse Father 88       pneumonia   Cancer Sister        breast   Diabetes Brother    Colon polyps Brother    Alcohol abuse Son    Stomach cancer Neg Hx    Rectal cancer Neg Hx    Esophageal cancer Neg Hx    Liver cancer Neg Hx    Colon cancer Neg Hx      Social Connections: Unknown (04/10/2023)   Social Connection and Isolation Panel [NHANES]    Frequency of Communication with Friends and Family: Not on file    Frequency of Social Gatherings with Friends and Family: Never    Attends Religious Services: Never    Database administrator or Organizations: No    Attends Engineer, structural: Not on file    Marital Status: Married      Current Outpatient Medications:    amLODipine  (NORVASC ) 5 MG tablet, Take 1 tablet (5 mg total) by mouth daily., Disp: 90 tablet, Rfl: 1   aspirin EC 81 MG tablet, Take 81 mg by mouth daily., Disp: , Rfl:    atorvastatin  (LIPITOR) 10 MG tablet, TAKE 1 TABLET(10 MG) BY MOUTH DAILY, Disp: 90 tablet, Rfl: 0   fenofibrate  160 MG tablet, Take 1 tablet (160 mg total) by mouth daily. Pt needs office visit for further refills.,  Disp: 90 tablet, Rfl: 0   ibuprofen (ADVIL) 200 MG tablet, , Disp: , Rfl:    losartan  (COZAAR ) 50 MG tablet, TAKE 1 TABLET(50 MG) BY MOUTH DAILY, Disp: 90 tablet, Rfl: 1   omeprazole (PRILOSEC) 20 MG capsule, Take 20 mg by mouth daily., Disp: , Rfl:    Physical Exam:   BP (!) 147/76 (BP Location: Right Arm, Patient Position: Sitting, Cuff Size: Large)   Pulse 73   Ht 5\' 10"  (1.778 m)   Wt 195 lb (88.5 kg)   SpO2 96%   BMI 27.98 kg/m   Salient findings:  CN II-XII intact Bilateral EAC clear and TM intact with well pneumatized middle ear spaces Anterior rhinoscopy: Septum intact; bilateral inferior turbinates without significant hypertrophy No lesions of oral cavity/oropharynx No obviously  palpable neck masses/lymphadenopathy/thyromegaly No respiratory distress or stridor; voice strong, class I  Seprately Identifiable Procedures:   None  Impression & Plans:  David Nielsen is a 73 y.o. male with:  1. 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 Stable in size, but given persistence, we discussed options: US  v/s repeat Bx Will proceed with repeat FNA; if stable or suspicious, he'd like to monitor Will call with results  See below regarding exact medications prescribed this encounter including dosages and route: No orders of the defined types were placed in this encounter.     Thank you for allowing me the opportunity to care for your patient. Please do not hesitate to contact me should you have any other questions.  Sincerely, Milon Aloe, MD Otolaryngologist (ENT), [facility] Phone: [phone] Fax: [phone]  03/13/2024, 2:35 PM   I have personally spent 47 minutes involved in face-to-face and non-face-to-face activities for this patient on the day of the visit.  Professional time spent excludes any procedures performed but includes the following activities, in addition to those noted in the documentation: preparing to see the patient (review of outside documentation and results), performing a medically appropriate examination, counseling, documenting in the electronic health record, independently interpreting results (multiple ultrasounds, biopsy review).

## 2024-03-13 NOTE — Patient Instructions (Signed)
 I have ordered an imaging study for you to complete prior to your next visit. Please call [facility] Scheduling at [phone] to schedule your imaging if you have not received a call within 24 hours. If you are unable to complete your imaging study prior to your next scheduled visit please call our office to let us know.

## 2024-03-28 ENCOUNTER — Other Ambulatory Visit: Payer: Self-pay | Admitting: Family Medicine

## 2024-03-28 DIAGNOSIS — I10 Essential (primary) hypertension: Secondary | ICD-10-CM

## 2024-03-28 DIAGNOSIS — E785 Hyperlipidemia, unspecified: Secondary | ICD-10-CM

## 2024-04-02 ENCOUNTER — Ambulatory Visit
Admission: RE | Admit: 2024-04-02 | Discharge: 2024-04-02 | Disposition: A | Discharge status: Home / Self Care | Source: Ambulatory Visit | Attending: Otolaryngology

## 2024-04-02 ENCOUNTER — Other Ambulatory Visit (HOSPITAL_COMMUNITY)
Admission: RE | Admit: 2024-04-02 | Discharge: 2024-04-02 | Disposition: A | Discharge status: Home / Self Care | Source: Ambulatory Visit | Attending: Physician Assistant | Admitting: Physician Assistant

## 2024-04-02 DIAGNOSIS — E041 Nontoxic single thyroid nodule: Secondary | ICD-10-CM | POA: Diagnosis not present

## 2024-04-04 ENCOUNTER — Other Ambulatory Visit: Payer: Self-pay | Admitting: Family Medicine

## 2024-04-04 DIAGNOSIS — I10 Essential (primary) hypertension: Secondary | ICD-10-CM

## 2024-04-04 DIAGNOSIS — E785 Hyperlipidemia, unspecified: Secondary | ICD-10-CM

## 2024-04-04 LAB — CYTOLOGY - NON PAP

## 2024-05-02 ENCOUNTER — Encounter: Payer: Self-pay | Admitting: Family Medicine

## 2024-05-02 DIAGNOSIS — I10 Essential (primary) hypertension: Secondary | ICD-10-CM

## 2024-05-02 MED ORDER — LOSARTAN POTASSIUM 50 MG PO TABS: 50.0000 mg | ORAL_TABLET | Freq: Every day | ORAL | 0 refills | Status: DC

## 2024-05-09 ENCOUNTER — Ambulatory Visit

## 2024-05-21 ENCOUNTER — Ambulatory Visit

## 2024-05-25 ENCOUNTER — Ambulatory Visit: Admitting: Family Medicine

## 2024-06-05 ENCOUNTER — Other Ambulatory Visit: Payer: Self-pay | Admitting: Family Medicine

## 2024-06-05 DIAGNOSIS — I10 Essential (primary) hypertension: Secondary | ICD-10-CM

## 2024-06-12 ENCOUNTER — Ambulatory Visit: Admitting: Family Medicine

## 2024-06-14 ENCOUNTER — Encounter: Payer: Self-pay | Admitting: Family Medicine

## 2024-06-14 ENCOUNTER — Ambulatory Visit: Admitting: Family Medicine

## 2024-06-14 DIAGNOSIS — I10 Essential (primary) hypertension: Secondary | ICD-10-CM

## 2024-06-20 ENCOUNTER — Other Ambulatory Visit: Payer: Self-pay

## 2024-06-20 DIAGNOSIS — I10 Essential (primary) hypertension: Secondary | ICD-10-CM

## 2024-06-20 MED ORDER — LOSARTAN POTASSIUM 50 MG PO TABS: 50.0000 mg | ORAL_TABLET | Freq: Every day | ORAL | 2 refills | Status: DC

## 2024-06-21 ENCOUNTER — Ambulatory Visit (INDEPENDENT_AMBULATORY_CARE_PROVIDER_SITE_OTHER): Admitting: Family Medicine

## 2024-06-21 ENCOUNTER — Encounter: Payer: Self-pay | Admitting: Family Medicine

## 2024-06-21 VITALS — BP 124/76 | HR 76 | Temp 97.8°F | Resp 16 | Ht 70.0 in | Wt 196.6 lb

## 2024-06-21 DIAGNOSIS — E785 Hyperlipidemia, unspecified: Secondary | ICD-10-CM | POA: Diagnosis not present

## 2024-06-21 DIAGNOSIS — I10 Essential (primary) hypertension: Secondary | ICD-10-CM

## 2024-06-21 DIAGNOSIS — R351 Nocturia: Secondary | ICD-10-CM | POA: Diagnosis not present

## 2024-06-21 DIAGNOSIS — Z23 Encounter for immunization: Secondary | ICD-10-CM

## 2024-06-21 LAB — CBC WITH DIFFERENTIAL/PLATELET
Basophils Absolute: 0 K/uL (ref 0.0–0.1)
Basophils Relative: 0.8 % (ref 0.0–3.0)
Eosinophils Absolute: 0.1 K/uL (ref 0.0–0.7)
Eosinophils Relative: 2.3 % (ref 0.0–5.0)
HCT: 40 % (ref 39.0–52.0)
Hemoglobin: 13.5 g/dL (ref 13.0–17.0)
Lymphocytes Relative: 22.5 % (ref 12.0–46.0)
Lymphs Abs: 0.9 K/uL (ref 0.7–4.0)
MCHC: 33.8 g/dL (ref 30.0–36.0)
MCV: 85.8 fl (ref 78.0–100.0)
Monocytes Absolute: 0.3 K/uL (ref 0.1–1.0)
Monocytes Relative: 8.2 % (ref 3.0–12.0)
Neutro Abs: 2.8 K/uL (ref 1.4–7.7)
Neutrophils Relative %: 66.2 % (ref 43.0–77.0)
Platelets: 287 K/uL (ref 150.0–400.0)
RBC: 4.66 Mil/uL (ref 4.22–5.81)
RDW: 13.6 % (ref 11.5–15.5)
WBC: 4.2 K/uL (ref 4.0–10.5)

## 2024-06-21 LAB — COMPREHENSIVE METABOLIC PANEL WITH GFR
ALT: 19 U/L (ref 0–53)
AST: 24 U/L (ref 0–37)
Albumin: 4.3 g/dL (ref 3.5–5.2)
Alkaline Phosphatase: 34 U/L — ABNORMAL LOW (ref 39–117)
BUN: 15 mg/dL (ref 6–23)
CO2: 30 meq/L (ref 19–32)
Calcium: 9.6 mg/dL (ref 8.4–10.5)
Chloride: 102 meq/L (ref 96–112)
Creatinine, Ser: 0.97 mg/dL (ref 0.40–1.50)
GFR: 77.07 mL/min (ref >60.00)
Glucose, Bld: 97 mg/dL (ref 70–99)
Potassium: 4 meq/L (ref 3.5–5.1)
Sodium: 140 meq/L (ref 135–145)
Total Bilirubin: 0.5 mg/dL (ref 0.2–1.2)
Total Protein: 6.7 g/dL (ref 6.0–8.3)

## 2024-06-21 LAB — LIPID PANEL
Cholesterol: 129 mg/dL (ref 0–200)
HDL: 45.3 mg/dL (ref >39.00)
LDL Cholesterol: 63 mg/dL (ref 0–99)
NonHDL: 83.52
Total CHOL/HDL Ratio: 3
Triglycerides: 101 mg/dL (ref 0.0–149.0)
VLDL: 20.2 mg/dL (ref 0.0–40.0)

## 2024-06-21 LAB — TSH: TSH: 2.29 u[IU]/mL (ref 0.35–5.50)

## 2024-06-21 LAB — PSA: PSA: 2.61 ng/mL (ref 0.10–4.00)

## 2024-06-21 MED ORDER — FENOFIBRATE 160 MG PO TABS: 160.0000 mg | ORAL_TABLET | Freq: Every day | ORAL | 1 refills | Status: AC

## 2024-06-21 MED ORDER — AMLODIPINE BESYLATE 5 MG PO TABS: 5.0000 mg | ORAL_TABLET | Freq: Every day | ORAL | 1 refills | Status: AC

## 2024-06-21 MED ORDER — LOSARTAN POTASSIUM 50 MG PO TABS: 50.0000 mg | ORAL_TABLET | Freq: Every day | ORAL | 1 refills | Status: AC

## 2024-06-21 MED ORDER — ATORVASTATIN CALCIUM 10 MG PO TABS: 10.0000 mg | ORAL_TABLET | Freq: Every day | ORAL | 1 refills | Status: AC

## 2024-06-21 NOTE — Patient Instructions (Signed)

## 2024-06-21 NOTE — Progress Notes (Signed)
 Subjective:    Patient ID: David Nielsen, male    DOB: 1951-06-11, 73 y.o.   MRN: [id number]  Chief Complaint  Patient presents with   Hypertension   Hyperlipidemia   Follow-up    HPI Patient is in today for f/u bp and cholesterol.  Discussed the use of AI scribe software for clinical note transcription with the patient, who gave verbal consent to proceed.  History of Present Illness David Nielsen is a 73 year old male who presents for a follow-up visit after experiencing a fever and sore throat.  Two weeks ago, he experienced a fever lasting for two days, accompanied by a sore throat. The fever and chills occurred only in the evenings. He managed the symptoms with Tylenol, which resolved the fever after two days.  A cough has persisted since the initial symptoms but is improving and is 'almost gone'.  He has not yet received his flu shot for the season. He declined the shingles vaccine, stating 'don't bother asking'.  He is careful with his back, performing stretches when feeling tightness to prevent stress on his muscles.    Past Medical History:  Diagnosis Date   Chicken pox    GERD (gastroesophageal reflux disease)    H/O gastroesophageal reflux (GERD)    HTN (hypertension)    Hyperlipidemia    PVC's (premature ventricular contractions)     Past Surgical History:  Procedure Laterality Date   COLONOSCOPY     none      Family History  Problem Relation Age of Onset   Stroke Mother        Smoker   Alcohol abuse Father 24       pneumonia   Cancer Sister        breast   Diabetes Brother    Colon polyps Brother    Alcohol abuse Son    Stomach cancer Neg Hx    Rectal cancer Neg Hx    Esophageal cancer Neg Hx    Liver cancer Neg Hx    Colon cancer Neg Hx     Social History   Socioeconomic History   Marital status: Married    Spouse name: Not on file   Number of children: 2   Years of education: Not on file   Highest education level: Some  college, no degree  Occupational History   Occupation: semi-retired    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[employer], 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[employer]  Tobacco Use   Smoking status: Never   Smokeless tobacco: Never  Substance and Sexual Activity   Alcohol use: Yes    Alcohol/week: 0.0 standard drinks of alcohol    Comment: rare -- wine -- weekend   Drug use: No   Sexual activity: Yes    Partners: Female, Male  Other Topics Concern   Not on file  Social History Narrative   Exercise-- 3x a week on total gym and treadmill   Social Drivers of Health   Financial Resource Strain: Low Risk  (05/02/2024)   Overall Financial Resource Strain (CARDIA)    Difficulty of Paying Living Expenses: Not hard at all  Food Insecurity: No Food Insecurity (05/02/2024)   Hunger Vital Sign    Worried About Running Out of Food in the Last Year: Never true    Ran Out of Food in the Last Year: Never true  Transportation Needs: No Transportation Needs (05/02/2024)   PRAPARE - Administrator, Civil Service (Medical):  No    Lack of Transportation (Non-Medical): No  Physical Activity: Insufficiently Active (05/02/2024)   Exercise Vital Sign    Days of Exercise per Week: 4 days    Minutes of Exercise per Session: 20 min  Stress: No Stress Concern Present (05/02/2024)   [employer] of Occupational Health - Occupational Stress Questionnaire    Feeling of Stress: Not at all  Social Connections: Socially Isolated (05/02/2024)   Social Connection and Isolation Panel    Frequency of Communication with Friends and Family: Never    Frequency of Social Gatherings with Friends and Family: Never    Attends Religious Services: Never    Database administrator or Organizations: No    Attends Engineer, structural: Not on file    Marital Status: Married  Catering manager Violence: Not on file    Outpatient Medications Prior to Visit  Medication Sig Dispense Refill   aspirin EC 81 MG tablet  Take 81 mg by mouth daily.     ibuprofen (ADVIL) 200 MG tablet      omeprazole (PRILOSEC) 20 MG capsule Take 20 mg by mouth daily.     amLODipine  (NORVASC ) 5 MG tablet Take 1 tablet (5 mg total) by mouth daily. 90 tablet 1   atorvastatin  (LIPITOR) 10 MG tablet TAKE 1 TABLET(10 MG) BY MOUTH DAILY 90 tablet 0   fenofibrate  160 MG tablet Take 1 tablet (160 mg total) by mouth daily. 90 tablet 0   losartan  (COZAAR ) 50 MG tablet Take 1 tablet (50 mg total) by mouth daily. 30 tablet 2   No facility-administered medications prior to visit.    No Known Allergies  Review of Systems  Constitutional:  Negative for chills, fever and malaise/fatigue.  HENT:  Negative for congestion and hearing loss.   Eyes:  Negative for blurred vision and discharge.  Respiratory:  Negative for cough, sputum production and shortness of breath.   Cardiovascular:  Negative for chest pain, palpitations and leg swelling.  Gastrointestinal:  Negative for abdominal pain, blood in stool, constipation, diarrhea, heartburn, nausea and vomiting.  Genitourinary:  Negative for dysuria, frequency, hematuria and urgency.  Musculoskeletal:  Negative for back pain, falls and myalgias.  Skin:  Negative for rash.  Neurological:  Negative for dizziness, sensory change, loss of consciousness, weakness and headaches.  Endo/Heme/Allergies:  Negative for environmental allergies. Does not bruise/bleed easily.  Psychiatric/Behavioral:  Negative for depression and suicidal ideas. The patient is not nervous/anxious and does not have insomnia.        Objective:    Physical Exam Vitals and nursing note reviewed.  Constitutional:      General: He is not in acute distress.    Appearance: Normal appearance. He is well-developed.  HENT:     Head: Normocephalic and atraumatic.     Right Ear: Tympanic membrane, ear canal and external ear normal. There is no impacted cerumen.     Left Ear: Tympanic membrane, ear canal and external ear normal.  There is no impacted cerumen.     Nose: Nose normal.     Mouth/Throat:     Mouth: Mucous membranes are moist.     Pharynx: Oropharynx is clear. No oropharyngeal exudate or posterior oropharyngeal erythema.  Eyes:     General: No scleral icterus.       Right eye: No discharge.        Left eye: No discharge.     Conjunctiva/sclera: Conjunctivae normal.     Pupils: Pupils are equal, round,  and reactive to light.  Neck:     Thyroid : No thyromegaly.     Vascular: No JVD.  Cardiovascular:     Rate and Rhythm: Normal rate and regular rhythm.     Heart sounds: Normal heart sounds. No murmur heard. Pulmonary:     Effort: Pulmonary effort is normal. No respiratory distress.     Breath sounds: Normal breath sounds.  Abdominal:     General: Bowel sounds are normal. There is no distension.     Palpations: Abdomen is soft. There is no mass.     Tenderness: There is no abdominal tenderness. There is no guarding or rebound.  Musculoskeletal:        General: Normal range of motion.     Cervical back: Normal range of motion and neck supple.     Right lower leg: No edema.     Left lower leg: No edema.  Lymphadenopathy:     Cervical: No cervical adenopathy.  Skin:    General: Skin is warm and dry.     Findings: No erythema or rash.  Neurological:     Mental Status: He is alert and oriented to person, place, and time.     Cranial Nerves: No cranial nerve deficit.     Motor: No abnormal muscle tone.     Deep Tendon Reflexes: Reflexes are normal and symmetric. Reflexes normal.  Psychiatric:        Mood and Affect: Mood normal.        Behavior: Behavior normal.        Thought Content: Thought content normal.        Judgment: Judgment normal.     BP 124/76 (BP Location: Left Arm, Patient Position: Sitting, Cuff Size: Normal)   Pulse 76   Temp 97.8 F (36.6 C) (Oral)   Resp 16   Ht 5' 10 (1.778 m)   Wt 196 lb 9.6 oz (89.2 kg)   SpO2 96%   BMI 28.21 kg/m  Wt Readings from Last 3  Encounters:  06/21/24 196 lb 9.6 oz (89.2 kg)  03/13/24 195 lb (88.5 kg)  03/05/24 204 lb (92.5 kg)    Diabetic Foot Exam - Simple   No data filed    Lab Results  Component Value Date   WBC 4.4 08/25/2023   HGB 13.8 08/25/2023   HCT 39.8 08/25/2023   PLT 275.0 08/25/2023   GLUCOSE 103 (H) 08/25/2023   CHOL 137 08/25/2023   TRIG 107.0 08/25/2023   HDL 45.40 08/25/2023   LDLDIRECT 81.0 08/20/2016   LDLCALC 70 08/25/2023   ALT 17 08/25/2023   AST 20 08/25/2023   NA 140 08/25/2023   K 3.9 08/25/2023   CL 103 08/25/2023   CREATININE 1.07 08/25/2023   BUN 18 08/25/2023   CO2 29 08/25/2023   TSH 2.16 08/25/2023   PSA 2.28 08/25/2023   HGBA1C 6.3 01/20/2021    Lab Results  Component Value Date   TSH 2.16 08/25/2023   Lab Results  Component Value Date   WBC 4.4 08/25/2023   HGB 13.8 08/25/2023   HCT 39.8 08/25/2023   MCV 87.3 08/25/2023   PLT 275.0 08/25/2023   Lab Results  Component Value Date   NA 140 08/25/2023   K 3.9 08/25/2023   CO2 29 08/25/2023   GLUCOSE 103 (H) 08/25/2023   BUN 18 08/25/2023   CREATININE 1.07 08/25/2023   BILITOT 0.5 08/25/2023   ALKPHOS 37 (L) 08/25/2023   AST 20 08/25/2023  ALT 17 08/25/2023   PROT 6.7 08/25/2023   ALBUMIN 4.4 08/25/2023   CALCIUM  10.2 08/25/2023   GFR 68.91 08/25/2023   Lab Results  Component Value Date   CHOL 137 08/25/2023   Lab Results  Component Value Date   HDL 45.40 08/25/2023   Lab Results  Component Value Date   LDLCALC 70 08/25/2023   Lab Results  Component Value Date   TRIG 107.0 08/25/2023   Lab Results  Component Value Date   CHOLHDL 3 08/25/2023   Lab Results  Component Value Date   HGBA1C 6.3 01/20/2021       Assessment & Plan:  Need for influenza vaccination -     Flu vaccine HIGH DOSE PF(Fluzone Trivalent)  Essential hypertension Assessment & Plan: Well controlled, no changes to meds. Encouraged heart healthy diet such as the DASH diet and exercise as tolerated.     Orders: -     amLODIPine  Besylate; Take 1 tablet (5 mg total) by mouth daily.  Dispense: 90 tablet; Refill: 1 -     Atorvastatin  Calcium ; Take 1 tablet (10 mg total) by mouth daily.  Dispense: 90 tablet; Refill: 1 -     Fenofibrate ; Take 1 tablet (160 mg total) by mouth daily.  Dispense: 90 tablet; Refill: 1 -     Losartan  Potassium; Take 1 tablet (50 mg total) by mouth daily.  Dispense: 90 tablet; Refill: 1 -     Lipid panel -     TSH -     Comprehensive metabolic panel with GFR -     CBC with Differential/Platelet  Hyperlipidemia LDL goal <100 Assessment & Plan: Encourage heart healthy diet such as MIND or DASH diet, increase exercise, avoid trans fats, simple carbohydrates and processed foods, consider a krill or fish or flaxseed oil cap daily.    Orders: -     Atorvastatin  Calcium ; Take 1 tablet (10 mg total) by mouth daily.  Dispense: 90 tablet; Refill: 1 -     Fenofibrate ; Take 1 tablet (160 mg total) by mouth daily.  Dispense: 90 tablet; Refill: 1 -     Lipid panel -     Comprehensive metabolic panel with GFR -     CBC with Differential/Platelet  Nocturia -     PSA  Assessment and Plan Assessment & Plan Encounter for immunization   He is due for his annual influenza vaccination and has agreed to receive it today. Administer influenza vaccination today.  Essential hypertension   Blood pressure is well-controlled.  Hyperlipidemia  Recent upper respiratory infection (resolved)   Symptoms of fever, sore throat, and cough resolved after two days with Tylenol. Cough is improving and almost gone.    Samauri Kellenberger R Lowne Chase, DO

## 2024-06-21 NOTE — Assessment & Plan Note (Signed)
 Well controlled, no changes to meds. Encouraged heart healthy diet such as the DASH diet and exercise as tolerated.

## 2024-06-21 NOTE — Assessment & Plan Note (Signed)
 Encourage heart healthy diet such as MIND or DASH diet, increase exercise, avoid trans fats, simple carbohydrates and processed foods, consider a krill or fish or flaxseed oil cap daily.

## 2024-06-24 ENCOUNTER — Ambulatory Visit: Payer: Self-pay | Admitting: Family Medicine

## 2024-06-25 ENCOUNTER — Telehealth: Payer: Self-pay | Admitting: Family Medicine

## 2024-06-25 NOTE — Telephone Encounter (Signed)
 Copied from CRM [id number]. Topic: Medicare AWV >> Jun 25, 2024  9:41 AM Nathanel DEL wrote: Reason for CRM: Called LVM 06/25/2024 to schedule AWV. Please schedule Virtual or Telehealth visits ONLY  Nathanel Paschal; Care Guide Ambulatory Clinical Support [place] [facility] Direct Dial: [phone]

## 2024-07-13 ENCOUNTER — Telehealth: Payer: Self-pay | Admitting: Family Medicine

## 2024-07-13 NOTE — Telephone Encounter (Signed)
 Copied from CRM [id number]. Topic: Medicare AWV >> Jul 13, 2024 11:52 AM Nathanel DEL wrote: Reason for CRM: Called LVM 07/13/2024; 06/25/2024 to schedule AWV. Please schedule Virtual or Telehealth visits ONLY  Nathanel Paschal; Care Guide Ambulatory Clinical Support [place] [facility] Direct Dial: [phone]

## 2024-11-21 ENCOUNTER — Telehealth: Payer: Self-pay | Admitting: *Deleted

## 2024-11-21 NOTE — Telephone Encounter (Signed)
 Pt is due for Medicare AWV and 6 month follow up with PCP (due 12/19/24). The AWV can be scheduled on the wellness visit 1 schedule as mychart video or in person. (No telephone call per insurance recommendation.) We can try to set up both the AWV and 6 month f/u with PCP in person on the same day if pt prefers. Left message for pt to return my call.

## 2024-11-22 NOTE — Telephone Encounter (Signed)
 Left message to return my call.
